# Patient Record
Sex: Female | Born: 1970
Health system: Southern US, Community
[De-identification: ages and names within clinical notes are randomized; demographics above are authoritative.]

## PROBLEM LIST (undated history)

## (undated) DIAGNOSIS — K200 Eosinophilic esophagitis: Secondary | ICD-10-CM

## (undated) DIAGNOSIS — G43909 Migraine, unspecified, not intractable, without status migrainosus: Secondary | ICD-10-CM

## (undated) DIAGNOSIS — K219 Gastro-esophageal reflux disease without esophagitis: Secondary | ICD-10-CM

## (undated) DIAGNOSIS — Z9889 Other specified postprocedural states: Secondary | ICD-10-CM

## (undated) DIAGNOSIS — F329 Major depressive disorder, single episode, unspecified: Secondary | ICD-10-CM

## (undated) DIAGNOSIS — F419 Anxiety disorder, unspecified: Secondary | ICD-10-CM

## (undated) DIAGNOSIS — K589 Irritable bowel syndrome without diarrhea: Secondary | ICD-10-CM

## (undated) DIAGNOSIS — F32A Depression, unspecified: Secondary | ICD-10-CM

## (undated) DIAGNOSIS — R112 Nausea with vomiting, unspecified: Secondary | ICD-10-CM

## (undated) HISTORY — DX: Eosinophilic esophagitis: K20.0

## (undated) HISTORY — DX: Major depressive disorder, single episode, unspecified: F32.9

## (undated) HISTORY — DX: Depression, unspecified: F32.A

## (undated) HISTORY — PX: OTHER SURGICAL HISTORY: SHX169

## (undated) HISTORY — DX: Irritable bowel syndrome, unspecified: K58.9

## (undated) HISTORY — DX: Migraine, unspecified, not intractable, without status migrainosus: G43.909

## (undated) HISTORY — PX: LAPAROSCOPIC ABDOMINAL EXPLORATION: SHX6249

---

## 1998-02-23 ENCOUNTER — Encounter: Admission: RE | Admit: 1998-02-23 | Discharge: 1998-05-24 | Payer: Self-pay | Admitting: Family Medicine

## 2002-03-03 ENCOUNTER — Other Ambulatory Visit: Admission: RE | Admit: 2002-03-03 | Discharge: 2002-03-03 | Payer: Self-pay | Admitting: Unknown Physician Specialty

## 2002-10-11 ENCOUNTER — Encounter: Payer: Self-pay | Admitting: Unknown Physician Specialty

## 2002-10-11 ENCOUNTER — Ambulatory Visit (HOSPITAL_COMMUNITY): Admission: RE | Admit: 2002-10-11 | Discharge: 2002-10-11 | Payer: Self-pay | Admitting: Unknown Physician Specialty

## 2002-10-29 ENCOUNTER — Ambulatory Visit (HOSPITAL_COMMUNITY): Admission: RE | Admit: 2002-10-29 | Discharge: 2002-10-29 | Payer: Self-pay | Admitting: Unknown Physician Specialty

## 2002-10-29 ENCOUNTER — Encounter: Payer: Self-pay | Admitting: Unknown Physician Specialty

## 2004-07-18 ENCOUNTER — Ambulatory Visit (HOSPITAL_COMMUNITY): Admission: RE | Admit: 2004-07-18 | Discharge: 2004-07-18 | Payer: Self-pay | Admitting: Unknown Physician Specialty

## 2004-11-22 ENCOUNTER — Ambulatory Visit (HOSPITAL_COMMUNITY): Admission: RE | Admit: 2004-11-22 | Discharge: 2004-11-22 | Payer: Self-pay | Admitting: Obstetrics & Gynecology

## 2007-11-26 HISTORY — PX: DILATION AND CURETTAGE OF UTERUS: SHX78

## 2009-06-13 ENCOUNTER — Ambulatory Visit: Payer: Self-pay | Admitting: Cardiology

## 2011-04-12 NOTE — Op Note (Signed)
Kayla White, BANFIELD    ACCOUNT NO.:  [id number]   MEDICAL RECORD NO.:  [id number]          PATIENT TYPE:  AMB   LOCATION:  DAY                           FACILITY:  [facility]   PHYSICIAN:  Lazaro Arms, M.D.   DATE OF BIRTH:  [birth year]   DATE OF PROCEDURE:  11/22/2004  DATE OF DISCHARGE:  11/22/2004                                 OPERATIVE REPORT   PREOPERATIVE DIAGNOSIS:  1.  Menometrorrhagia.  2.  Dysmenorrhea.   POSTOPERATIVE DIAGNOSIS:  1.  Menometrorrhagia.  2.  Dysmenorrhea.   PROCEDURE:  Diagnostic laparoscopy with chromotubation.   ANESTHESIA:  General endotracheal.   SURGEON:  Dr. Despina Hidden.   FINDINGS:  The patient had a normal pelvis. She had no endometriosis.  Fallopian tubes were normal. Ovaries and uterus were normal. There was free  flow of the methylene blue dye. The patient had a normal liver, appendix.  All of the peritoneal cavity was normal. There was no evidence of any  endometriosis of any type. There was no adhesive disease. There was no  evidence of any infection.   DESCRIPTION OF PROCEDURE:  The patient was taken to the operating room,  placed in supine position where she underwent general endotracheal  anesthesia. She was then placed in the dorsal lithotomy position and prepped  and draped in the usual sterile fashion. Her bladder was drained, and  incision was made in the umbilicus, and direct visualization trocar was used  and placed in the peritoneal cavity with one pass without difficulty.  Incision was made two fingerbreadths above the umbilicus, and a 5-mm blunt  tip trocar was then placed without difficulty. The above noted findings were  seen. Again, uterus, ovaries, tubes are normal. Normal fimbria. There was  good fill and spill rapidly of the methylene blue dye. There was no evidence  of any endometriosis anywhere. Uterosacral ligaments were normal. Appendix  was normal. Liver and upper abdomen was also normal. The patient  tolerated  the procedure well. Once irrigated it and got rid of all the dye, the  instruments were removed, gas was allowed to escape, the fascia  in the umbilical incision was closed, and the skin was closed using  Dermabond. The patient tolerated the procedure well. She experienced minimal  blood loss and was taken to recovery in good stable condition. All counts  were correct.      LHE/MEDQ  D:  01/23/2005  T:  01/23/2005  Job:  [id number]

## 2015-03-20 ENCOUNTER — Ambulatory Visit (HOSPITAL_COMMUNITY): Payer: MEDICAID | Admitting: Psychiatry

## 2016-06-04 ENCOUNTER — Encounter: Payer: Self-pay | Admitting: Neurology

## 2016-06-04 ENCOUNTER — Ambulatory Visit (INDEPENDENT_AMBULATORY_CARE_PROVIDER_SITE_OTHER): Payer: Self-pay | Admitting: Neurology

## 2016-06-04 VITALS — BP 122/73 | HR 79 | Ht 69.0 in | Wt 168.0 lb

## 2016-06-04 DIAGNOSIS — G4486 Cervicogenic headache: Secondary | ICD-10-CM

## 2016-06-04 DIAGNOSIS — R51 Headache: Secondary | ICD-10-CM

## 2016-06-04 DIAGNOSIS — S161XXS Strain of muscle, fascia and tendon at neck level, sequela: Secondary | ICD-10-CM

## 2016-06-04 DIAGNOSIS — S161XXA Strain of muscle, fascia and tendon at neck level, initial encounter: Secondary | ICD-10-CM | POA: Insufficient documentation

## 2016-06-04 MED ORDER — GABAPENTIN 300 MG PO CAPS: 300.0000 mg | ORAL_CAPSULE | Freq: Three times a day (TID) | ORAL | Status: DC

## 2016-06-04 MED ORDER — MELOXICAM 15 MG PO TABS
15.0000 mg | ORAL_TABLET | Freq: Every day | ORAL | Status: DC
Start: 2016-06-04 — End: 2016-10-24
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
## 2016-06-04 NOTE — Patient Instructions (Signed)
With the gabapentin, start at one at night for 5 days, then take one twice a day for 5 days, then take one three times a day.   Neurontin (gabapentin) may result in drowsiness, ankle swelling, gait instability, or possibly dizziness. Please contact our office if significant side effects occur with this medication.  Cervical Sprain A cervical sprain is an injury in the neck in which the strong, fibrous tissues (ligaments) that connect your neck bones stretch or tear. Cervical sprains can range from mild to severe. Severe cervical sprains can cause the neck vertebrae to be unstable. This can lead to damage of the spinal cord and can result in serious nervous system problems. The amount of time it takes for a cervical sprain to get better depends on the cause and extent of the injury. Most cervical sprains heal in 1 to 3 weeks. CAUSES  Severe cervical sprains may be caused by:   Contact sport injuries (such as from football, rugby, wrestling, hockey, auto racing, gymnastics, diving, martial arts, or boxing).   Motor vehicle collisions.   Whiplash injuries. This is an injury from a sudden forward and backward whipping movement of the head and neck.  Falls.  Mild cervical sprains may be caused by:   Being in an awkward position, such as while cradling a telephone between your ear and shoulder.   Sitting in a chair that does not offer proper support.   Working at a poorly Marketing executive station.   Looking up or down for long periods of time.  SYMPTOMS   Pain, soreness, stiffness, or a burning sensation in the front, back, or sides of the neck. This discomfort may develop immediately after the injury or slowly, 24 hours or more after the injury.   Pain or tenderness directly in the middle of the back of the neck.   Shoulder or upper back pain.   Limited ability to move the neck.   Headache.   Dizziness.   Weakness, numbness, or tingling in the hands or arms.    Muscle spasms.   Difficulty swallowing or chewing.   Tenderness and swelling of the neck.  DIAGNOSIS  Most of the time your health care provider can diagnose a cervical sprain by taking your history and doing a physical exam. Your health care provider will ask about previous neck injuries and any known neck problems, such as arthritis in the neck. X-rays may be taken to find out if there are any other problems, such as with the bones of the neck. Other tests, such as a CT scan or MRI, may also be needed.  TREATMENT  Treatment depends on the severity of the cervical sprain. Mild sprains can be treated with rest, keeping the neck in place (immobilization), and pain medicines. Severe cervical sprains are immediately immobilized. Further treatment is done to help with pain, muscle spasms, and other symptoms and may include:  Medicines, such as pain relievers, numbing medicines, or muscle relaxants.   Physical therapy. This may involve stretching exercises, strengthening exercises, and posture training. Exercises and improved posture can help stabilize the neck, strengthen muscles, and help stop symptoms from returning.  HOME CARE INSTRUCTIONS   Put ice on the injured area.   Put ice in a plastic bag.   Place a towel between your skin and the bag.   Leave the ice on for 15-20 minutes, 3-4 times a day.   If your injury was severe, you may have been given a cervical collar to wear.  A cervical collar is a two-piece collar designed to keep your neck from moving while it heals.  Do not remove the collar unless instructed by your health care provider.  If you have long hair, keep it outside of the collar.  Ask your health care provider before making any adjustments to your collar. Minor adjustments may be required over time to improve comfort and reduce pressure on your chin or on the back of your head.  Ifyou are allowed to remove the collar for cleaning or bathing, follow your  health care provider's instructions on how to do so safely.  Keep your collar clean by wiping it with mild soap and water and drying it completely. If the collar you have been given includes removable pads, remove them every 1-2 days and hand wash them with soap and water. Allow them to air dry. They should be completely dry before you wear them in the collar.  If you are allowed to remove the collar for cleaning and bathing, wash and dry the skin of your neck. Check your skin for irritation or sores. If you see any, tell your health care provider.  Do not drive while wearing the collar.   Only take over-the-counter or prescription medicines for pain, discomfort, or fever as directed by your health care provider.   Keep all follow-up appointments as directed by your health care provider.   Keep all physical therapy appointments as directed by your health care provider.   Make any needed adjustments to your workstation to promote good posture.   Avoid positions and activities that make your symptoms worse.   Warm up and stretch before being active to help prevent problems.  SEEK MEDICAL CARE IF:   Your pain is not controlled with medicine.   You are unable to decrease your pain medicine over time as planned.   Your activity level is not improving as expected.  SEEK IMMEDIATE MEDICAL CARE IF:   You develop any bleeding.  You develop stomach upset.  You have signs of an allergic reaction to your medicine.   Your symptoms get worse.   You develop new, unexplained symptoms.   You have numbness, tingling, weakness, or paralysis in any part of your body.  MAKE SURE YOU:   Understand these instructions.  Will watch your condition.  Will get help right away if you are not doing well or get worse.   This information is not intended to replace advice given to you by your health care provider. Make sure you discuss any questions you have with your health care  provider.   Document Released: 09/08/2007 Document Revised: 11/16/2013 Document Reviewed: 05/19/2013 Elsevier Interactive Patient Education [employer]2016 [employer] Inc[employer]

## 2016-09-04 ENCOUNTER — Ambulatory Visit: Payer: Self-pay | Admitting: Adult Health
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
## 2016-09-05 ENCOUNTER — Encounter: Payer: Self-pay | Admitting: Adult Health

## 2016-10-22 ENCOUNTER — Ambulatory Visit: Payer: Self-pay | Admitting: Adult Health

## 2016-10-24 ENCOUNTER — Encounter: Payer: Self-pay | Admitting: Adult Health

## 2016-10-24 ENCOUNTER — Ambulatory Visit (INDEPENDENT_AMBULATORY_CARE_PROVIDER_SITE_OTHER): Payer: Self-pay | Admitting: Adult Health

## 2016-10-24 VITALS — BP 113/61 | HR 78 | Wt 171.0 lb

## 2016-10-24 DIAGNOSIS — S161XXS Strain of muscle, fascia and tendon at neck level, sequela: Secondary | ICD-10-CM

## 2016-10-24 DIAGNOSIS — R51 Headache: Secondary | ICD-10-CM

## 2016-10-24 DIAGNOSIS — G4486 Cervicogenic headache: Secondary | ICD-10-CM
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
## 2016-10-24 NOTE — Progress Notes (Signed)
I have read the note, and I agree with the clinical assessment and plan.  Alyda Megna KEITH   

## 2016-10-24 NOTE — Patient Instructions (Signed)
Continue Nortriptyline Can consider restarting gabapentin in the future If your symptoms worsen or you develop new symptoms please let us know.

## 2017-02-11 ENCOUNTER — Encounter: Payer: Self-pay | Admitting: Adult Health

## 2017-03-31 DIAGNOSIS — Z6824 Body mass index (BMI) 24.0-24.9, adult: Secondary | ICD-10-CM | POA: Diagnosis not present

## 2017-03-31 DIAGNOSIS — Z01419 Encounter for gynecological examination (general) (routine) without abnormal findings: Secondary | ICD-10-CM | POA: Diagnosis not present

## 2017-09-29 DIAGNOSIS — Z23 Encounter for immunization: Secondary | ICD-10-CM | POA: Diagnosis not present

## 2017-10-14 DIAGNOSIS — R51 Headache: Secondary | ICD-10-CM | POA: Diagnosis not present

## 2017-10-14 DIAGNOSIS — J301 Allergic rhinitis due to pollen: Secondary | ICD-10-CM | POA: Diagnosis not present

## 2017-10-14 DIAGNOSIS — M797 Fibromyalgia: Secondary | ICD-10-CM | POA: Diagnosis not present

## 2017-10-14 DIAGNOSIS — K589 Irritable bowel syndrome without diarrhea: Secondary | ICD-10-CM | POA: Diagnosis not present

## 2017-10-15 DIAGNOSIS — N949 Unspecified condition associated with female genital organs and menstrual cycle: Secondary | ICD-10-CM | POA: Diagnosis not present

## 2017-12-23 ENCOUNTER — Ambulatory Visit (INDEPENDENT_AMBULATORY_CARE_PROVIDER_SITE_OTHER): Payer: BLUE CROSS/BLUE SHIELD | Admitting: Neurology

## 2017-12-23 ENCOUNTER — Encounter: Payer: Self-pay | Admitting: Neurology

## 2017-12-23 VITALS — BP 125/63 | HR 93 | Ht 69.0 in | Wt 174.0 lb

## 2017-12-23 DIAGNOSIS — S161XXS Strain of muscle, fascia and tendon at neck level, sequela: Secondary | ICD-10-CM | POA: Diagnosis not present

## 2017-12-23 DIAGNOSIS — G4486 Cervicogenic headache: Secondary | ICD-10-CM

## 2017-12-23 DIAGNOSIS — R51 Headache: Secondary | ICD-10-CM | POA: Diagnosis not present

## 2017-12-23 MED ORDER — TIZANIDINE HCL 2 MG PO TABS: 2.0000 mg | ORAL_TABLET | Freq: Three times a day (TID) | ORAL | 3 refills | Status: DC

## 2017-12-23 NOTE — Patient Instructions (Signed)
   We will make a referral to physical therapy, and start tizanidine 2 mg tablet three times a day.
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
## 2017-12-25 ENCOUNTER — Ambulatory Visit: Payer: BLUE CROSS/BLUE SHIELD

## 2017-12-25 DIAGNOSIS — R05 Cough: Secondary | ICD-10-CM | POA: Diagnosis not present

## 2017-12-25 DIAGNOSIS — J069 Acute upper respiratory infection, unspecified: Secondary | ICD-10-CM | POA: Diagnosis not present

## 2017-12-25 DIAGNOSIS — J0100 Acute maxillary sinusitis, unspecified: Secondary | ICD-10-CM | POA: Diagnosis not present

## 2018-01-02 ENCOUNTER — Ambulatory Visit: Payer: Medicaid Other | Attending: Neurology | Admitting: Rehabilitation

## 2018-01-02 ENCOUNTER — Other Ambulatory Visit: Payer: Self-pay

## 2018-01-02 ENCOUNTER — Encounter: Payer: Self-pay | Admitting: Rehabilitation

## 2018-01-02 DIAGNOSIS — G44221 Chronic tension-type headache, intractable: Secondary | ICD-10-CM | POA: Insufficient documentation

## 2018-01-02 DIAGNOSIS — M542 Cervicalgia: Secondary | ICD-10-CM

## 2018-01-02 DIAGNOSIS — R293 Abnormal posture: Secondary | ICD-10-CM | POA: Diagnosis present
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
## 2018-01-05 ENCOUNTER — Ambulatory Visit: Payer: Medicaid Other | Admitting: Physical Therapy

## 2018-01-05 DIAGNOSIS — M542 Cervicalgia: Secondary | ICD-10-CM

## 2018-01-05 DIAGNOSIS — G44221 Chronic tension-type headache, intractable: Secondary | ICD-10-CM

## 2018-01-05 DIAGNOSIS — R293 Abnormal posture: Secondary | ICD-10-CM

## 2018-01-06 ENCOUNTER — Encounter: Payer: Self-pay | Admitting: Physical Therapy

## 2018-01-06 NOTE — Therapy (Signed)
[facility] [address] [place], [place], [address] Phone: [phone]   Fax:  [phone]  Physical Therapy Treatment  Patient Details  Name: Kayla White MRN: [id number] Date of Birth: 02/15/1971 Referring Provider: Stephanie Acreharles Willis, MD   Encounter Date: 01/05/2018  PT End of Session - 01/06/18 0841    Visit Number  2    Number of Visits  17    Date for PT Re-Evaluation  03/03/18    Authorization Type  BCBS 40.00 copay    PT Start Time  0215    PT Stop Time  0259    PT Time Calculation (min)  44 min    Activity Tolerance  Patient limited by pain    Behavior During Therapy  [facility] for tasks assessed/performed       Past Medical History:  Diagnosis Date  . Depression   . IBS (irritable bowel syndrome)     Past Surgical History:  Procedure Laterality Date  . LAPAROSCOPIC ABDOMINAL EXPLORATION     R/o endometriosis    There were no vitals filed for this visit.  Subjective Assessment - 01/06/18 0839    Subjective  Patient reports no change in her heacahce and the pain in her neck. She comes in today for a trial of trigger point dry needling.     Pertinent History  history of depression    Limitations  House hold activities;Sitting;Standing    How long can you sit comfortably?  Driving increases pain and headaches, sitting for long periods of time [employer](church and movies)    Patient Stated Goals  to get rid of these headaches    Currently in Pain?  Yes    Pain Score  3     Pain Location  Neck    Pain Orientation  Right;Left    Pain Descriptors / Indicators  Aching;Dull;Throbbing    Pain Type  Chronic pain    Pain Onset  More than a month ago    Pain Frequency  Constant    Aggravating Factors   sitting     Pain Relieving Factors  laying on an icepack                       [facility]/Exercise - 01/06/18 0001      Manual Therapy   Manual Therapy  Myofascial release;Soft tissue  mobilization;Manual Traction    Myofascial Release  IASTYM to bilateral upper traps; sub occipital release     Manual Traction  gentle to cervical spine        Trigger Point Dry Needling - 01/06/18 1158    Consent Given?  Yes    Education Handout Provided  Yes    Muscles Treated Upper Body  Upper trapezius;Suboccipitals muscle group;Longissimus    Upper Trapezius Response  Twitch reponse elicited    SubOccipitals Response  Twitch response elicited    Longissimus Response  Twitch response elicited           PT Education - 01/06/18 0840    Education provided  Yes    Education Details  benefits and risks of TPDN     Person(s) Educated  Patient    Methods  Explanation    Comprehension  Verbalized understanding;Returned demonstration;Verbal cues required       PT Short Term Goals - 01/02/18 1308      PT SHORT TERM GOAL #1   Title  Pt will be independent with initial HEP in order  to indicate improved mobility and decreased pain/headaches.  (Target Date: 02/01/18)    Time  4    Period  Weeks    Status  New      PT SHORT TERM GOAL #2   Title  Pt will report decrease in headaches by 25% in order to indicate decrease muscle tension and improved cervical mobility.      Time  4    Period  Weeks    Status  New      PT SHORT TERM GOAL #3   Title  Pt will improve cervical ROM by 5 deg in order to indicate improved functional cervical mobility.      Time  4    Period  Weeks    Status  New      PT SHORT TERM GOAL #4   Title  Pt will be able to perform neck flexor muscle endurance test for 10 secs in order to indicate improved deep cervical flexor strength.      Time  4    Period  Weeks    Status  New      PT SHORT TERM GOAL #5   Title  Pt will report no more than 7/10 pain at worst with functional mobility in order to indicate improved cervical mobility and strength.     Time  4    Period  Weeks    Status  New        PT Long Term Goals - 01/02/18 1318      PT LONG TERM  GOAL #1   Title  Pt will be independent with final HEP in order to indicate improved mobility and decreased pain/headaches.  (Target Date: 03/03/18)    Time  8    Period  Weeks    Status  New    Target Date  03/03/18      PT LONG TERM GOAL #2   Title  Pt will report decrease in headaches by 50% in order to indicate decreased muscle tension and improved cervical mobility.      Time  8    Period  Weeks    Status  New      PT LONG TERM GOAL #3   Title  Pt will improve cervical ROM (except L rotation) by 10 deg from baseline in order to indicate improved functional mobility.     Time  8    Period  Weeks    Status  New      PT LONG TERM GOAL #4   Title  Pt will report no more than 5/10 pain at worst in order to indicate improved cervical mobility and strength.     Time  8    Period  Weeks    Status  New      PT LONG TERM GOAL #5   Title  Pt will perform neck flexor endurance test x 20 secs in order to indicate improved deep cervical flexor strength.      Time  8    Period  Weeks    Status  New      Additional Long Term Goals   Additional Long Term Goals  Yes      PT LONG TERM GOAL #6   Title  Pt will verbalize return to leisure activities (working out, riding bike at gym) in order to indicate decrease pain and headaches limiting activity.      Time  8    Period  Weeks  Status  New            Plan - 01/06/18 1610    Clinical Impression Statement  Patient is tolerated treatment well. she had a good respose to suboccpital needling. She aslo had a good twitch reponse with the left upper trap. She also had a fair twitch repsose around c3-c4 paraspinals. Therapy reviwed self soft tissue mobilization. She would benefit from postural exercises and chin tucks next visit.     Clinical Presentation  Evolving    Clinical Decision Making  Moderate    Rehab Potential  Good    PT Frequency  2x / week    PT Duration  8 weeks    PT Next Visit Plan  assess shoulder strength, any  scapular dysfunction?, pt is highly irritable therefore take it slow with mobilization but needs PAs and lateral glides to improve mid to lower cervical spine mobility, upper thoracic spine mobility, soft tissue mobilization to upper cervical paraspinal muscles (dry needling here to help relieve headaches), suboccipital release, retraction (with traction when able), deep cervical flexor strengthening/endurance; assess tolerance to TPDN     Consulted and Agree with Plan of Care  Patient       Patient will benefit from skilled therapeutic intervention in order to improve the following deficits and impairments:  Decreased activity tolerance, Decreased range of motion, Hypomobility, Impaired perceived functional ability, Impaired flexibility, Postural dysfunction, Impaired sensation, Pain  Visit Diagnosis: Cervicalgia  Abnormal posture  Chronic tension-type headache, intractable     Problem List Patient Active Problem List   Diagnosis Date Noted  . Cervical strain 06/04/2016  . Cervicogenic headache 06/04/2016    Dessie Coma PT DPT  01/06/2018, 12:59 PM  [facility] [address] [place], [place], [address] Phone: [phone]   Fax:  [phone]  Name: Kayla White MRN: [id number] Date of Birth: 1971/06/02

## 2018-01-13 ENCOUNTER — Ambulatory Visit: Payer: Medicaid Other | Admitting: Physical Therapy

## 2018-01-13 ENCOUNTER — Telehealth: Payer: Self-pay | Admitting: Physical Therapy

## 2018-01-13 NOTE — Telephone Encounter (Signed)
Left message on mobile number regarding missed appointment and advised of next appointment time. Encouraged to contact us with questions. Shonica Weier C. Layliana Devins PT, DPT 01/13/18 12:13 PM

## 2018-01-15 ENCOUNTER — Ambulatory Visit: Payer: Medicaid Other | Admitting: Rehabilitation

## 2018-01-15 ENCOUNTER — Ambulatory Visit: Payer: Medicaid Other | Admitting: Physical Therapy

## 2018-01-15 ENCOUNTER — Encounter: Payer: Self-pay | Admitting: Physical Therapy

## 2018-01-15 DIAGNOSIS — G44221 Chronic tension-type headache, intractable: Secondary | ICD-10-CM

## 2018-01-15 DIAGNOSIS — R293 Abnormal posture: Secondary | ICD-10-CM

## 2018-01-15 DIAGNOSIS — M542 Cervicalgia: Secondary | ICD-10-CM

## 2018-01-15 NOTE — Therapy (Signed)
[facility] [address] [place], [place], [address] Phone: [phone]   Fax:  [phone]  Physical Therapy Treatment  Patient Details  Name: Kayla White MRN: [id number] Date of Birth: 05/23/1971 Referring Provider: Stephanie Acreharles Willis, MD   Encounter Date: 01/15/2018  PT End of Session - 01/15/18 1138    Visit Number  3    Number of Visits  17    Date for PT Re-Evaluation  03/03/18    PT Start Time  1017    PT Stop Time  1101    PT Time Calculation (min)  44 min    Activity Tolerance  Patient tolerated treatment well    Behavior During Therapy  [facility] for tasks assessed/performed       Past Medical History:  Diagnosis Date  . Depression   . IBS (irritable bowel syndrome)     Past Surgical History:  Procedure Laterality Date  . LAPAROSCOPIC ABDOMINAL EXPLORATION     R/o endometriosis    There were no vitals filed for this visit.  Subjective Assessment - 01/15/18 1021    Subjective  "I think the DN really helped calm it down, I only have a low grade HA today."    Currently in Pain?  Yes                      [facility] Adult PT Treatment/Exercise - 01/15/18 1142      Cryotherapy   Number Minutes Cryotherapy  10 Minutes    Cryotherapy Location  Cervical    Type of Cryotherapy  Ice pack      Manual Therapy   Manual Therapy  Joint mobilization;Soft tissue mobilization;Myofascial release;Taping    Manual therapy comments  skilled palpations and monitoring pt throughout treatment    Joint Mobilization  Grade 3 PA of T1-T7, and L first rib mob grade 3 with pt focusing on breathing in/out deeply to faciliate mob with movement.     Soft tissue mobilization  IASTM over bil upper trap/ levator scapulae, sub-occiptals    Myofascial Release  Fasctila rolling/ stretching of bil upper trap/ levator scapulae    Manual Traction  gentle to cervical spine     McConnell  L upper trap inhibition taping trial        Trigger Point Dry Needling - 01/15/18 1036    Consent Given?  Yes    Education Handout Provided  Yes    Muscles Treated Upper Body  Levator scapulae    Upper Trapezius Response  Palpable increased muscle length;Twitch reponse elicited    SubOccipitals Response  Twitch response elicited;Palpable increased muscle length    Levator Scapulae Response  Twitch response elicited;Palpable increased muscle length bil     Longissimus Response  Twitch response elicited;Palpable increased muscle length bil C4           PT Education - 01/15/18 1138    Education provided  Yes    Education Details  updated HEP for neck stability and stretching, benefits of upper trap inhibition taping and length of wear.     Person(s) Educated  Patient    Methods  Explanation;Verbal cues;Handout    Comprehension  Verbalized understanding;Verbal cues required       PT Short Term Goals - 01/02/18 1308      PT SHORT TERM GOAL #1   Title  Pt will be independent with initial HEP in order to indicate improved mobility and decreased pain/headaches.  (Target Date: 02/01/18)  Time  4    Period  Weeks    Status  New      PT SHORT TERM GOAL #2   Title  Pt will report decrease in headaches by 25% in order to indicate decrease muscle tension and improved cervical mobility.      Time  4    Period  Weeks    Status  New      PT SHORT TERM GOAL #3   Title  Pt will improve cervical ROM by 5 deg in order to indicate improved functional cervical mobility.      Time  4    Period  Weeks    Status  New      PT SHORT TERM GOAL #4   Title  Pt will be able to perform neck flexor muscle endurance test for 10 secs in order to indicate improved deep cervical flexor strength.      Time  4    Period  Weeks    Status  New      PT SHORT TERM GOAL #5   Title  Pt will report no more than 7/10 pain at worst with functional mobility in order to indicate improved cervical mobility and strength.     Time  4    Period  Weeks     Status  New        PT Long Term Goals - 01/02/18 1318      PT LONG TERM GOAL #1   Title  Pt will be independent with final HEP in order to indicate improved mobility and decreased pain/headaches.  (Target Date: 03/03/18)    Time  8    Period  Weeks    Status  New    Target Date  03/03/18      PT LONG TERM GOAL #2   Title  Pt will report decrease in headaches by 50% in order to indicate decreased muscle tension and improved cervical mobility.      Time  8    Period  Weeks    Status  New      PT LONG TERM GOAL #3   Title  Pt will improve cervical ROM (except L rotation) by 10 deg from baseline in order to indicate improved functional mobility.     Time  8    Period  Weeks    Status  New      PT LONG TERM GOAL #4   Title  Pt will report no more than 5/10 pain at worst in order to indicate improved cervical mobility and strength.     Time  8    Period  Weeks    Status  New      PT LONG TERM GOAL #5   Title  Pt will perform neck flexor endurance test x 20 secs in order to indicate improved deep cervical flexor strength.      Time  8    Period  Weeks    Status  New      Additional Long Term Goals   Additional Long Term Goals  Yes      PT LONG TERM GOAL #6   Title  Pt will verbalize return to leisure activities (working out, riding bike at gym) in order to indicate decrease pain and headaches limiting activity.      Time  8    Period  Weeks    Status  New  Plan - 01/15/18 1139    Clinical Impression Statement  pt reports she continues to have a low grade HA but the DN helped signficantly. Continued DN for bil sub-occipitals, cervical parapsinals at C5, upper trap/ levator scapulae. followed DN with IASTM and mobs for the thoracic spine and L first rib. She reported decreased tightness and improvement of her HA post session, just soreness from DN. Ice pakc post session.     PT Treatment/Interventions  ADLs/Self Care Home Management;Electrical  Stimulation;Ultrasound;Therapeutic activities;Therapeutic exercise;Neuromuscular re-education;Patient/family education;Manual techniques;Dry needling    PT Next Visit Plan  assess shoulder strength, any scapular dysfunction?, pt is highly irritable therefore take it slow with mobilization but needs PAs and lateral glides to improve mid to lower cervical spine mobility, upper thoracic spine mobility, soft tissue mobilization to upper cervical paraspinal muscles (dry needling here to help relieve headaches), suboccipital release, retraction (with traction when able), deep cervical flexor strengthening/endurance; assess tolerance to TPDN     PT Home Exercise Plan  upper trap stretch, levator scapulae stretching, upper cervical rotation, chin tuck (staring in supine)    Consulted and Agree with Plan of Care  Patient       Patient will benefit from skilled therapeutic intervention in order to improve the following deficits and impairments:  Decreased activity tolerance, Decreased range of motion, Hypomobility, Impaired perceived functional ability, Impaired flexibility, Postural dysfunction, Impaired sensation, Pain  Visit Diagnosis: Cervicalgia  Abnormal posture  Chronic tension-type headache, intractable     Problem List Patient Active Problem List   Diagnosis Date Noted  . Cervical strain 06/04/2016  . Cervicogenic headache 06/04/2016   Lulu Riding PT, DPT, LAT, ATC  01/15/18  11:46 AM      [facility] [address] [place], [place], [address] Phone: [phone]   Fax:  [phone]  Name: Kayla White MRN: [id number] Date of Birth: Apr 28, 1971

## 2018-01-16 ENCOUNTER — Ambulatory Visit: Payer: Medicaid Other | Admitting: Rehabilitation

## 2018-01-19 ENCOUNTER — Ambulatory Visit: Payer: Medicaid Other | Admitting: Physical Therapy

## 2018-01-19 ENCOUNTER — Encounter: Payer: Self-pay | Admitting: Physical Therapy

## 2018-01-19 DIAGNOSIS — M542 Cervicalgia: Secondary | ICD-10-CM | POA: Diagnosis not present

## 2018-01-19 DIAGNOSIS — R293 Abnormal posture: Secondary | ICD-10-CM

## 2018-01-19 NOTE — Therapy (Addendum)
[facility] [address] [place], [place], [address] Phone: [phone]   Fax:  [phone]  Physical Therapy Treatment  Patient Details  Name: Kayla White MRN: [id number] Date of Birth: [birth year] Referring Provider: Hoda Hon Acre, MD   Encounter Date: 01/19/2018  PT End of Session - 01/19/18 1313    Visit Number  4    Number of Visits  17    Date for PT Re-Evaluation  03/03/18    Authorization Type  [employer]BS 40.00 copay    PT Start Time  1229    PT Stop Time  1312    PT Time Calculation (min)  43 min    Activity Tolerance  Patient tolerated treatment well    Behavior During Therapy  [facility] for tasks assessed/performed       Past Medical History:  Diagnosis Date  . Depression   . IBS (irritable bowel syndrome)     Past Surgical History:  Procedure Laterality Date  . LAPAROSCOPIC ABDOMINAL EXPLORATION     R/o endometriosis    There were no vitals filed for this visit.  Subjective Assessment - 01/19/18 1231    Subjective  still having a little soreness from DN last week, but open to DN again today.    Patient Stated Goals  to get rid of these headaches    Currently in Pain?  Yes    Pain Score  2     Pain Location  Neck    Pain Orientation  Right;Left    Pain Descriptors / Indicators  Aching;Dull;Throbbing    Pain Type  Chronic pain    Pain Onset  More than a month ago    Pain Frequency  Constant    Aggravating Factors   sitting    Pain Relieving Factors  lying on ice pack                      [facility] Adult PT Treatment/Exercise - 01/19/18 0001      Manual Therapy   Manual therapy comments  skilled palpations and monitoring pt throughout treatment    Soft tissue mobilization  bil upper trap/ levator scapulae, sub-occiptals    Myofascial Release  suboccipital release and trigger point release to bil neck and upper traps/levator scapluae    McConnell  L upper trap inhibition  taping       Trigger Point Dry Needling - 01/19/18 1356    Consent Given?  Yes    Muscles Treated Upper Body  Upper trapezius;Suboccipitals muscle group;Levator scapulae    Upper Trapezius Response  Twitch reponse elicited;Palpable increased muscle length    SubOccipitals Response  Twitch response elicited;Palpable increased muscle length    Levator Scapulae Response  Twitch response elicited;Palpable increased muscle length             PT Short Term Goals - 01/02/18 1308      PT SHORT TERM GOAL #1   Title  Pt will be independent with initial HEP in order to indicate improved mobility and decreased pain/headaches.  (Target Date: 02/01/18)    Time  4    Period  Weeks    Status  New      PT SHORT TERM GOAL #2   Title  Pt will report decrease in headaches by 25% in order to indicate decrease muscle tension and improved cervical mobility.      Time  4    Period  Weeks    Status  New  PT SHORT TERM GOAL #3   Title  Pt will improve cervical ROM by 5 deg in order to indicate improved functional cervical mobility.      Time  4    Period  Weeks    Status  New      PT SHORT TERM GOAL #4   Title  Pt will be able to perform neck flexor muscle endurance test for 10 secs in order to indicate improved deep cervical flexor strength.      Time  4    Period  Weeks    Status  New      PT SHORT TERM GOAL #5   Title  Pt will report no more than 7/10 pain at worst with functional mobility in order to indicate improved cervical mobility and strength.     Time  4    Period  Weeks    Status  New        PT Long Term Goals - 01/02/18 1318      PT LONG TERM GOAL #1   Title  Pt will be independent with final HEP in order to indicate improved mobility and decreased pain/headaches.  (Target Date: 03/03/18)    Time  8    Period  Weeks    Status  New    Target Date  03/03/18      PT LONG TERM GOAL #2   Title  Pt will report decrease in headaches by 50% in order to indicate decreased  muscle tension and improved cervical mobility.      Time  8    Period  Weeks    Status  New      PT LONG TERM GOAL #3   Title  Pt will improve cervical ROM (except L rotation) by 10 deg from baseline in order to indicate improved functional mobility.     Time  8    Period  Weeks    Status  New      PT LONG TERM GOAL #4   Title  Pt will report no more than 5/10 pain at worst in order to indicate improved cervical mobility and strength.     Time  8    Period  Weeks    Status  New      PT LONG TERM GOAL #5   Title  Pt will perform neck flexor endurance test x 20 secs in order to indicate improved deep cervical flexor strength.      Time  8    Period  Weeks    Status  New      Additional Long Term Goals   Additional Long Term Goals  Yes      PT LONG TERM GOAL #6   Title  Pt will verbalize return to leisure activities (working out, riding bike at gym) in order to indicate decrease pain and headaches limiting activity.      Time  8    Period  Weeks    Status  New            Plan - 01/19/18 1313    Clinical Impression Statement  Pt tolerated DN well today; has tightness in SCM so recommend DN to this area next DN session.  Progressing slowly towards goals.    PT Treatment/Interventions  ADLs/Self Care Home Management;Electrical Stimulation;Ultrasound;Therapeutic activities;Therapeutic exercise;Neuromuscular re-education;Patient/family education;Manual techniques;Dry needling    PT Next Visit Plan  assess shoulder strength, any scapular dysfunction?, pt is highly irritable therefore take it slow  with mobilization but needs PAs and lateral glides to improve mid to lower cervical spine mobility, upper thoracic spine mobility, soft tissue mobilization to upper cervical paraspinal muscles (dry needling here to help relieve headaches), suboccipital release, retraction (with traction when able), deep cervical flexor strengthening/endurance; assess tolerance to TPDN; DN to SCM     Consulted and Agree with Plan of Care  Patient       Patient will benefit from skilled therapeutic intervention in order to improve the following deficits and impairments:  Decreased activity tolerance, Decreased range of motion, Hypomobility, Impaired perceived functional ability, Impaired flexibility, Postural dysfunction, Impaired sensation, Pain  Visit Diagnosis: Abnormal posture  Cervicalgia     Problem List Patient Active Problem List   Diagnosis Date Noted  . Cervical strain 06/04/2016  . Cervicogenic headache 06/04/2016      Clarita Crane, PT, DPT 01/19/18 1:56 PM    [facility] [address] [place], [place], [address] Phone: [phone]   Fax:  [phone]  Name: Kayla White MRN: [id number] Date of Birth: [birth year]

## 2018-01-22 ENCOUNTER — Ambulatory Visit: Payer: Medicaid Other | Admitting: Rehabilitation

## 2018-01-26 ENCOUNTER — Ambulatory Visit: Payer: Medicaid Other | Admitting: Physical Therapy

## 2018-01-29 ENCOUNTER — Ambulatory Visit: Payer: Medicaid Other | Admitting: Rehabilitation

## 2018-02-02 ENCOUNTER — Ambulatory Visit: Payer: Medicaid Other | Attending: Neurology | Admitting: Physical Therapy

## 2018-02-09 ENCOUNTER — Telehealth: Payer: Self-pay | Admitting: Physical Therapy

## 2018-02-09 ENCOUNTER — Ambulatory Visit: Payer: Self-pay | Admitting: Physical Therapy

## 2018-02-09 NOTE — Telephone Encounter (Signed)
Pt. Called this AM requesting to transfer care to [facility] location as it is closer to her home. Pt verbalized frustrations in the timeframe of our scheduling and period of awaiting insurance authorization. Asked patient to allow me time to gather some info so I had accurate information and I would f/u with her this PM. We have been playing phone tag/leaving voicemails for each other. When able to connect with patient, will work to get her scheduled for a visit at [facility] this week in order to submit info to insurance for auth and then allow for her requested transfer to [facility] location. During our conversation this AM, pt did not wish to be treated at [facility] as she feels the [facility] clinic is better equipped for her current needs. Will wait to confirm plan with patient when able to connect on phone again.

## 2018-02-09 NOTE — Telephone Encounter (Signed)
Spoke with patient and explained weintended to assess current status and request insurance authorization on 3/11 and she was unable to keep that appointment. Plan is for next visit to re-assess and [facility]. Pt agreed for reassessment at [facility] (scheduled tomorrow at 1:30) and then given number to [facility] supervisor to call and schedule treatment to begin next week.

## 2018-02-10 ENCOUNTER — Ambulatory Visit: Payer: Self-pay | Admitting: Physical Therapy

## 2018-02-10 NOTE — Telephone Encounter (Signed)
Pt called to cancel re-evaluation today due to unable to arrange childcare and make the commute. Asked if she could just be seen an [facility]. Explained that clinically better for re-assessment with a provider familiar to you when possible. Because of her reported circumstances, I offered to speak with the [facility] supervisor and see about just scheduling at that location for reassessment. I explained the first visit, regardless of location, would require a reassessment and not treatment because her insurance requires prior authorization for treatment. She verbalized understanding. After the call, spoke with Leia AlfBeth Murray, [facility] Supervisor, and she will contact patient to schedule.

## 2018-02-11 ENCOUNTER — Ambulatory Visit: Payer: Self-pay | Admitting: Physical Therapy

## 2018-02-16 ENCOUNTER — Ambulatory Visit: Payer: Self-pay | Admitting: Physical Therapy

## 2018-02-18 ENCOUNTER — Encounter: Payer: Self-pay | Admitting: Physical Therapy

## 2018-02-19 ENCOUNTER — Encounter (HOSPITAL_COMMUNITY): Payer: Self-pay

## 2018-02-19 ENCOUNTER — Ambulatory Visit (HOSPITAL_COMMUNITY): Payer: Medicaid Other | Attending: Family Medicine

## 2018-02-19 ENCOUNTER — Ambulatory Visit: Payer: Self-pay | Admitting: Rehabilitation

## 2018-02-19 DIAGNOSIS — R293 Abnormal posture: Secondary | ICD-10-CM | POA: Insufficient documentation

## 2018-02-19 DIAGNOSIS — G44221 Chronic tension-type headache, intractable: Secondary | ICD-10-CM | POA: Insufficient documentation

## 2018-02-19 DIAGNOSIS — M542 Cervicalgia: Secondary | ICD-10-CM | POA: Diagnosis present
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
## 2018-02-23 ENCOUNTER — Ambulatory Visit: Payer: Self-pay | Admitting: Rehabilitation

## 2018-02-23 ENCOUNTER — Ambulatory Visit: Payer: Self-pay | Admitting: Physical Therapy

## 2018-02-26 ENCOUNTER — Encounter (HOSPITAL_COMMUNITY): Payer: Self-pay

## 2018-02-26 ENCOUNTER — Telehealth (HOSPITAL_COMMUNITY): Payer: Self-pay

## 2018-02-26 ENCOUNTER — Ambulatory Visit: Payer: Self-pay | Admitting: Rehabilitation

## 2018-02-26 ENCOUNTER — Ambulatory Visit (HOSPITAL_COMMUNITY): Payer: No Typology Code available for payment source | Attending: Family Medicine

## 2018-02-26 DIAGNOSIS — R293 Abnormal posture: Secondary | ICD-10-CM | POA: Insufficient documentation

## 2018-02-26 DIAGNOSIS — M542 Cervicalgia: Secondary | ICD-10-CM | POA: Diagnosis present

## 2018-02-26 DIAGNOSIS — G44221 Chronic tension-type headache, intractable: Secondary | ICD-10-CM | POA: Insufficient documentation

## 2018-02-26 NOTE — Telephone Encounter (Signed)
I did run the [facility] for today with same response=-not eligible. . I informed the patient that she would be self-pay before she left and patient was fine with that. I ask pt to make more apptments and she only wants to do one at a time. NF 4/4

## 2018-02-26 NOTE — Therapy (Signed)
[facility] [address] [place], [place], [address] Phone: [phone]   Fax:  [phone]  Physical Therapy Treatment  Patient Details  Name: Kayla White MRN: [id number] Date of Birth: 1971/05/27 Referring Provider: Stephanie Acre, MD   Encounter Date: 02/26/2018  PT End of Session - 02/26/18 0828    Visit Number  2    Number of Visits  4    Date for PT Re-Evaluation  03/12/18    Authorization Type  Primary: BCBS, Secondary: Medicaid (submitted authorization for initial 3 visits on 02/19/18)    Authorization Time Period  02/19/18 to 03/12/18    Authorization - Visit Number  1    Authorization - Number of Visits  3    PT Start Time  0825    PT Stop Time  0914    PT Time Calculation (min)  49 min    Activity Tolerance  Patient tolerated treatment well    Behavior During Therapy  [facility] for tasks assessed/performed       Past Medical History:  Diagnosis Date  . Depression   . IBS (irritable bowel syndrome)     Past Surgical History:  Procedure Laterality Date  . LAPAROSCOPIC ABDOMINAL EXPLORATION     R/o endometriosis    There were no vitals filed for this visit.  Subjective Assessment - 02/26/18 0827    Subjective  Pt states that her HA really eased off after about 30-40 mins following last treatment session. Currently, she's having about 3/10 pain.     How long can you sit comfortably?  15-20 mins    Diagnostic tests  3 x-rays: has shown loss of natural cervical curve; per pt upper cervical spinal curve is coming back but the lower cervical curve is not    Currently in Pain?  Yes    Pain Score  3     Pain Location  Head    Pain Orientation  Posterior;Anterior    Pain Descriptors / Indicators  Dull;Aching    Pain Type  Chronic pain    Pain Onset  More than a month ago    Pain Frequency  Intermittent    Aggravating Factors   sitting still looking at something    Pain Relieving Factors  lying on ice pack    Effect  of Pain on Daily Activities  increases          [facility] Adult PT Treatment/Exercise - 02/26/18 0001      Manual Therapy   Manual Therapy  Manual Traction;Taping    Manual therapy comments  completed separate rest of treatment    Manual Traction  suboccipital release +gentle traction x4 mins at EOS for pain control    Kinesiotex  Inhibit Muscle      Kinesiotix   Inhibit Muscle   bil Upper trap inhibition x2 strips of tape each       Trigger Point Dry Needling - 02/26/18 0918    Consent Given?  Yes    Education Handout Provided  Yes    Muscles Treated Upper Body  Upper trapezius;Suboccipitals muscle group;Levator scapulae;Longissimus    Upper Trapezius Response  Twitch reponse elicited;Palpable increased muscle length    SubOccipitals Response  Twitch response elicited;Palpable increased muscle length    Levator Scapulae Response  Twitch response elicited;Palpable increased muscle length    Longissimus Response  Twitch response elicited;Palpable increased muscle length Cervical multifidis, splenius capitis/cervicis           PT Short  Term Goals - 02/19/18 1225      PT SHORT TERM GOAL #1   Title  Pt will be independent with initial HEP in order to indicate improved mobility and decreased pain/headaches.    Baseline  dependent    Time  3    Period  Weeks    Status  Achieved    Target Date  03/12/18      PT SHORT TERM GOAL #2   Title  Pt will report decrease in headaches by 25% in order to indicate decrease muscle tension and improved cervical mobility.      Baseline  3/28: reports headaches daily, increases as day goes on    Time  3    Period  Weeks    Status  On-going      PT SHORT TERM GOAL #3   Title  Pt will improve cervical ROM by 5 deg in order to indicate improved functional cervical mobility.      Baseline  3/28: flexion 38, ext 35, R LF 25, L LF 24, R rotation 63, L rotation 54    Time  3    Period  Weeks    Status  On-going      PT SHORT TERM GOAL #4    Title  Pt will be able to perform neck flexor muscle endurance test for 10 secs in order to indicate improved deep cervical flexor strength.      Baseline  3/28: unable to perform due to weakness at this time    Time  3    Period  Weeks    Status  On-going      PT SHORT TERM GOAL #5   Title  Pt will report no more than 7/10 pain at worst with functional mobility in order to indicate improved cervical mobility and strength.     Baseline  3/28: reports 10/10 at worst    Time  3    Period  Weeks    Status  On-going        PT Long Term Goals - 02/19/18 1227      PT LONG TERM GOAL #1   Title  Pt will be independent with final HEP in order to indicate improved mobility and decreased pain/headaches.    Time  9    Period  Weeks    Status  On-going    Target Date  04/23/18      PT LONG TERM GOAL #2   Title  Pt will report decrease in headaches by 50% in order to indicate decreased muscle tension and improved cervical mobility.      Baseline  3/28: reports headaches daily which increase as the day goes on    Time  9    Period  Weeks    Status  On-going      PT LONG TERM GOAL #3   Title  Pt will improve cervical ROM (except L rotation) by 10 deg from baseline in order to indicate improved functional mobility.     Baseline  3/28: flexion 38, ext 35, R LF 25, L LF 24, R rotation 63, L rotation 54    Time  9    Period  Weeks    Status  On-going      PT LONG TERM GOAL #4   Title  Pt will report no more than 5/10 pain at worst in order to indicate improved cervical mobility and strength.     Baseline  3/28: reports 10/10 at worst  Time  9    Period  Weeks    Status  On-going      PT LONG TERM GOAL #5   Title  Pt will perform neck flexor endurance test x 20 secs in order to indicate improved deep cervical flexor strength.      Baseline  3/28: unable to perform on eval due to weakness    Time  9    Period  Weeks    Status  On-going      PT LONG TERM GOAL #6   Title  Pt will  verbalize return to leisure activities (working out, riding bike at gym) in order to indicate decrease pain and headaches limiting activity.      Baseline  3/28: is not currently doing any leisure activity due to pain and headaches.     Time  9    Period  Weeks    Status  On-going            Plan - 02/26/18 7846    Clinical Impression Statement  Pt returns for her f/u visit for neck pain. Pt stating that her HA was decreased following manual suboccipital release last week, but it is still present this date. She continues with significant restrictions of upper trap, levator scap, suboccipitals, as well as cervical multifidis, splenius capitus/cervicis. PT dry needled each of these muscles this date, bilaterally. Pt with multiple twitch responses elicited throughout each muscle with palpable improvements in soft tissue restrictions. Ended with manual suboccipital release for continued pain control and muscle relaxation. Applied K-tape to bil upper trap for inhibition. Continue as planned, addressing soft tissue impairments to decrease overall pain.    Rehab Potential  Good    PT Frequency  1x / week    PT Duration  3 weeks    PT Treatment/Interventions  ADLs/Self Care Home Management;Electrical Stimulation;Ultrasound;Therapeutic activities;Therapeutic exercise;Neuromuscular re-education;Patient/family education;Manual techniques;Dry needling;Cryotherapy;Moist Heat;Traction;Balance training;Passive range of motion;Taping;Energy conservation    PT Next Visit Plan  Trigger point dry needling, taping for cervical musculature afterwards for continued postural proprioception input and pain control; manual STM, joint mobs, suboccipital release; lower cervical spine mobility; deep cervical flexor strengthening/endurance    PT Home Exercise Plan  3/28: educated pt to continue previously prescribed HEP    Consulted and Agree with Plan of Care  Patient       Patient will benefit from skilled therapeutic  intervention in order to improve the following deficits and impairments:  Decreased activity tolerance, Decreased range of motion, Hypomobility, Impaired flexibility, Postural dysfunction, Impaired sensation, Pain, Decreased strength, Increased fascial restricitons, Increased muscle spasms  Visit Diagnosis: Abnormal posture  Cervicalgia  Chronic tension-type headache, intractable     Problem List Patient Active Problem List   Diagnosis Date Noted  . Cervical strain 06/04/2016  . Cervicogenic headache 06/04/2016       Jac Canavan PT, DPT  [facility] [address] [place], [place], [address] Phone: [phone]   Fax:  [phone]  Name: Kayla White MRN: [id number] Date of Birth: Mar 30, 1971

## 2018-03-02 ENCOUNTER — Ambulatory Visit: Payer: Self-pay | Admitting: Rehabilitation

## 2018-03-05 ENCOUNTER — Ambulatory Visit: Payer: Medicaid Other | Admitting: Rehabilitation

## 2018-03-05 ENCOUNTER — Ambulatory Visit (HOSPITAL_COMMUNITY): Payer: No Typology Code available for payment source

## 2018-03-05 DIAGNOSIS — R293 Abnormal posture: Secondary | ICD-10-CM

## 2018-03-05 DIAGNOSIS — G44221 Chronic tension-type headache, intractable: Secondary | ICD-10-CM

## 2018-03-05 DIAGNOSIS — M542 Cervicalgia: Secondary | ICD-10-CM

## 2018-03-05 NOTE — Therapy (Signed)
[facility] [address] [place], [place], [address] Phone: [phone]   Fax:  [phone]  Physical Therapy Treatment  Patient Details  Name: Kayla White MRN: [id number] Date of Birth: Mar 28, 1971 Referring Provider: Stephanie Acre, MD   Encounter Date: 03/05/2018  PT End of Session - 03/05/18 1520    Visit Number  3    Number of Visits  4    Date for PT Re-Evaluation  03/12/18    Authorization Type  Primary: BC[employer], Secondary: Medicaid (submitted authorization for initial 3 visits on 02/19/18)    Authorization Time Period  02/19/18 to 03/12/18    Authorization - Visit Number  2    Authorization - Number of Visits  3    PT Start Time  1519    PT Stop Time  1608    PT Time Calculation (min)  49 min    Activity Tolerance  Patient tolerated treatment well    Behavior During Therapy  [facility] for tasks assessed/performed       Past Medical History:  Diagnosis Date  . Depression   . IBS (irritable bowel syndrome)     Past Surgical History:  Procedure Laterality Date  . LAPAROSCOPIC ABDOMINAL EXPLORATION     R/o endometriosis    There were no vitals filed for this visit.  Subjective Assessment - 03/05/18 1520    Subjective  Pt states that she has a bad HA right now. She has been at her kids' school sitting for 2 hours. She did well following her last treatment session. She had 4 good days after and didn't have to take anything for it until Sunday after.     How long can you sit comfortably?  15-20 mins    Diagnostic tests  3 x-rays: has shown loss of natural cervical curve; per pt upper cervical spinal curve is coming back but the lower cervical curve is not    Currently in Pain?  Yes    Pain Score  5     Pain Location  Head    Pain Orientation  Posterior;Anterior    Pain Descriptors / Indicators  Dull;Aching    Pain Type  Chronic pain    Pain Onset  More than a month ago    Pain Frequency  Intermittent    Aggravating  Factors   sitting still looking at something    Pain Relieving Factors  lying on ice pack    Effect of Pain on Daily Activities  increases           [facility] Adult PT Treatment/Exercise - 03/05/18 0001      Manual Therapy   Manual Therapy  Soft tissue mobilization;Manual Traction;Taping    Manual therapy comments  completed separate rest of treatment    Soft tissue mobilization  bil SCM    Manual Traction  suboccipital release +gentle traction for pain control x6 mins at beginning of session    Kinesiotex  Inhibit Muscle      Kinesiotix   Inhibit Muscle   bil upper trap and levator scap inhibition (2 "y" strips bilaterally)       Trigger Point Dry Needling - 03/05/18 1626    Consent Given?  Yes    Education Handout Provided  No    Muscles Treated Upper Body  Suboccipitals muscle group;Sternocleidomastoid    Sternocleidomastoid Response  Twitch response elicited;Palpable increased muscle length bil    SubOccipitals Response  Twitch response elicited;Palpable increased muscle length bil  PT Education - 03/05/18 1622    Education provided  Yes    Education Details  continue HEP, trigger point dry needling risks and benefits; remove tape if causes skin irritation    Person(s) Educated  Patient    Methods  Explanation    Comprehension  Verbalized understanding       PT Short Term Goals - 02/19/18 1225      PT SHORT TERM GOAL #1   Title  Pt will be independent with initial HEP in order to indicate improved mobility and decreased pain/headaches.    Baseline  dependent    Time  3    Period  Weeks    Status  Achieved    Target Date  03/12/18      PT SHORT TERM GOAL #2   Title  Pt will report decrease in headaches by 25% in order to indicate decrease muscle tension and improved cervical mobility.      Baseline  3/28: reports headaches daily, increases as day goes on    Time  3    Period  Weeks    Status  On-going      PT SHORT TERM GOAL #3   Title  Pt  will improve cervical ROM by 5 deg in order to indicate improved functional cervical mobility.      Baseline  3/28: flexion 38, ext 35, R LF 25, L LF 24, R rotation 63, L rotation 54    Time  3    Period  Weeks    Status  On-going      PT SHORT TERM GOAL #4   Title  Pt will be able to perform neck flexor muscle endurance test for 10 secs in order to indicate improved deep cervical flexor strength.      Baseline  3/28: unable to perform due to weakness at this time    Time  3    Period  Weeks    Status  On-going      PT SHORT TERM GOAL #5   Title  Pt will report no more than 7/10 pain at worst with functional mobility in order to indicate improved cervical mobility and strength.     Baseline  3/28: reports 10/10 at worst    Time  3    Period  Weeks    Status  On-going        PT Long Term Goals - 02/19/18 1227      PT LONG TERM GOAL #1   Title  Pt will be independent with final HEP in order to indicate improved mobility and decreased pain/headaches.    Time  9    Period  Weeks    Status  On-going    Target Date  04/23/18      PT LONG TERM GOAL #2   Title  Pt will report decrease in headaches by 50% in order to indicate decreased muscle tension and improved cervical mobility.      Baseline  3/28: reports headaches daily which increase as the day goes on    Time  9    Period  Weeks    Status  On-going      PT LONG TERM GOAL #3   Title  Pt will improve cervical ROM (except L rotation) by 10 deg from baseline in order to indicate improved functional mobility.     Baseline  3/28: flexion 38, ext 35, R LF 25, L LF 24, R rotation 63, L rotation 54  Time  9    Period  Weeks    Status  On-going      PT LONG TERM GOAL #4   Title  Pt will report no more than 5/10 pain at worst in order to indicate improved cervical mobility and strength.     Baseline  3/28: reports 10/10 at worst    Time  9    Period  Weeks    Status  On-going      PT LONG TERM GOAL #5   Title  Pt will  perform neck flexor endurance test x 20 secs in order to indicate improved deep cervical flexor strength.      Baseline  3/28: unable to perform on eval due to weakness    Time  9    Period  Weeks    Status  On-going      PT LONG TERM GOAL #6   Title  Pt will verbalize return to leisure activities (working out, riding bike at gym) in order to indicate decrease pain and headaches limiting activity.      Baseline  3/28: is not currently doing any leisure activity due to pain and headaches.     Time  9    Period  Weeks    Status  On-going            Plan - 03/05/18 1624    Clinical Impression Statement  Pt presents to therapy with increased HA this date. Began with suboccipital release and STM to bil SCMs; pt with palpable restrictions throughout and with referral pain into forehead with palpation to bil SCMs, L>R. Pt reported decreased HA with manual STM so PT proceeded with trigger point dry needling of bil suboccipitals and bil SCM. Good twitch responses elicited with each muscle group and decreasing HA symptoms reported. Had pt perform supine cervical retractions afterwards for muscle activation and relaxation. Applied rock tape to bil levator scap and UT for muscle inhibition this date (2 "y" strips, bilaterally). Pt stated that the K-tape started to itch and caused some skin irritation and she asked that a bandaid be placed where the anchor for the tape would be. Educated pt that if this tape also irritates her skin then to remove it as well. Pt reported decreased HA to 3/10 and stated that it was steadily going down and much improved from beginning of session. Continued as planned, progressing as able. Pt due for reassessment and reauthorization next visit.    Rehab Potential  Good    PT Frequency  1x / week    PT Duration  3 weeks    PT Treatment/Interventions  ADLs/Self Care Home Management;Electrical Stimulation;Ultrasound;Therapeutic activities;Therapeutic exercise;Neuromuscular  re-education;Patient/family education;Manual techniques;Dry needling;Cryotherapy;Moist Heat;Traction;Balance training;Passive range of motion;Taping;Energy conservation    PT Next Visit Plan  Reassessment and reauthorization; Continue trigger point dry needling, taping for cervical musculature afterwards for continued postural proprioception input and pain control; manual STM, joint mobs, suboccipital release; lower cervical spine mobility; deep cervical flexor strengthening/endurance    PT Home Exercise Plan  3/28: educated pt to continue previously prescribed HEP    Consulted and Agree with Plan of Care  Patient       Patient will benefit from skilled therapeutic intervention in order to improve the following deficits and impairments:  Decreased activity tolerance, Decreased range of motion, Hypomobility, Impaired flexibility, Postural dysfunction, Impaired sensation, Pain, Decreased strength, Increased fascial restricitons, Increased muscle spasms  Visit Diagnosis: Abnormal posture  Cervicalgia  Chronic tension-type headache, intractable  Problem List Patient Active Problem List   Diagnosis Date Noted  . Cervical strain 06/04/2016  . Cervicogenic headache 06/04/2016        Jac Canavan PT, DPT   [facility] [address] [place], [place], [address] Phone: [phone]   Fax:  [phone]  Name: Kayla White MRN: [id number] Date of Birth: 07-Mar-1971

## 2018-03-12 ENCOUNTER — Encounter (HOSPITAL_COMMUNITY): Payer: Self-pay

## 2018-03-12 ENCOUNTER — Ambulatory Visit (HOSPITAL_COMMUNITY): Payer: No Typology Code available for payment source

## 2018-03-12 DIAGNOSIS — G44221 Chronic tension-type headache, intractable: Secondary | ICD-10-CM

## 2018-03-12 DIAGNOSIS — R293 Abnormal posture: Secondary | ICD-10-CM | POA: Diagnosis not present

## 2018-03-12 DIAGNOSIS — M542 Cervicalgia: Secondary | ICD-10-CM

## 2018-03-12 NOTE — Therapy (Signed)
[place], [place], [address] Phone: [phone]   Fax:  [phone]    Progress Note Reporting Period 02/19/18 to 03/12/18  See note below for Objective Data and Assessment of Progress/Goals.    Physical Therapy Treatment/Reassessment  Patient Details  Name: Kayla White MRN: [id number] Date of Birth: 07-23-1971 Referring Provider: Margette Fast, MD   Encounter Date: 03/12/2018  PT End of Session - 03/12/18 1215    Visit Number  4    Number of Visits  16    Date for PT Re-Evaluation  04/23/18 reassess 04/02/2018    Authorization Type  Self-Pay; Medicaid? Per pt "in transition" for Medicaid -- PT submitted reauthorization for 12 more visits on 03/12/18    Authorization Time Period  02/19/18 to 03/12/18; NEW: 03/12/18 to 04/23/18    Authorization - Visit Number  0    Authorization - Number of Visits  12    PT Start Time  1030    PT Stop Time  1117    PT Time Calculation (min)  47 min    Activity Tolerance  Patient tolerated treatment well    Behavior During Therapy  [facility] for tasks assessed/performed       Past Medical History:  Diagnosis Date  . Depression   . IBS (irritable bowel syndrome)     Past Surgical History:  Procedure Laterality Date  . LAPAROSCOPIC ABDOMINAL EXPLORATION     R/o endometriosis    There were no vitals filed for this visit.  Subjective Assessment - 03/12/18 1032    Subjective  Pt states that she had the worse HA after Thursday's session She currently has a small, nagging HA. The Rock tape still caused her some skin irritation.     How long can you sit comfortably?  15-20 mins    Diagnostic tests  3 x-rays: has shown loss of natural cervical curve; per pt upper cervical spinal curve is coming back but the lower cervical curve is not    Currently in Pain?  Yes    Pain Score  2     Pain Location  Head    Pain Orientation  Anterior;Posterior    Pain Descriptors / Indicators   Aching;Dull    Pain Type  Chronic pain    Pain Onset  More than a month ago    Pain Frequency  Intermittent    Aggravating Factors   sitting still looking at something    Pain Relieving Factors  lying on ice pack    Effect of Pain on Daily Activities  increases         [facility] PT Assessment - 03/12/18 0001      Assessment   Medical Diagnosis  cervical strain, headaches    Referring Provider  Margette Fast, MD    Onset Date/Surgical Date  -- 2 YA    Prior Therapy  previous PT and chiropractor      Observation/Other Assessments   Observations  cervical extension and flexion noted to be primarily from upper cervical spine with min to no involvement from lower spine      Functional Tests   Functional tests  Other      Other:   Other/ Comments  Deep cervical neck flexion test: 6 sec without UE support -- pt almost instantly loses chin tuck and has increased pain      Posture/Postural Control   Posture/Postural Control  Postural limitations    Postural Limitations  Forward  head;Increased thoracic kyphosis      AROM   Cervical Flexion  38 was 38    Cervical Extension  35 was 35    Cervical - Right Side Bend  25 was 25    Cervical - Left Side Bend  27 was 24    Cervical - Right Rotation  78 was 63    Cervical - Left Rotation  54 was 54      Palpation   Spinal mobility  cervical spine hypomobile and tender; thoracic spine hypomobile    Palpation comment  soft tissue restrictions, multiple taut bands, and tenderness to palpation throughout cervical paraspinals, levator scap, middle trap, rhomboids, and cervical and thoracic paraspinals, L>R throughout           [facility] Adult PT Treatment/Exercise - 03/12/18 0001      Manual Therapy   Manual Therapy  Manual Traction    Manual therapy comments  completed separate rest of treatment    Manual Traction  suboccipital release +gentle traction for pain control x5 mins       Trigger Point Dry Needling - 03/12/18 1225    Consent  Given?  Yes    Education Handout Provided  No    Muscles Treated Upper Body  Upper trapezius;Suboccipitals muscle group;Levator scapulae cervical multifidis, splenius capitis/cervicis; bil thru out    Sternocleidomastoid Response  Twitch response elicited;Palpable increased muscle length    Upper Trapezius Response  Twitch reponse elicited;Palpable increased muscle length    SubOccipitals Response  Twitch response elicited;Palpable increased muscle length    Levator Scapulae Response  Twitch response elicited;Palpable increased muscle length    Longissimus Response  Twitch response elicited;Palpable increased muscle length cervical multifidis, splenius capitis and cervicis             PT Short Term Goals - 03/12/18 1036      PT SHORT TERM GOAL #1   Title  Pt will be independent with initial HEP in order to indicate improved mobility and decreased pain/headaches.    Baseline  dependent    Time  3    Period  Weeks    Status  Achieved      PT SHORT TERM GOAL #2   Title  Pt will report decrease in headaches by 25% in order to indicate decrease muscle tension and improved cervical mobility.      Baseline  4/18: HA are better, but not by 25%; still reports headaches daily, increases as day goes on    Time  3    Period  Weeks    Status  On-going      PT SHORT TERM GOAL #3   Title  Pt will improve cervical ROM by 5 deg in order to indicate improved functional cervical mobility.      Baseline  4/18: flexion 38, ext 35, R LF 25, L LF 27, R rotation 78, L rotation 54    Time  3    Period  Weeks    Status  On-going      PT SHORT TERM GOAL #4   Title  Pt will be able to perform neck flexor muscle endurance test for 10 secs in order to indicate improved deep cervical flexor strength.      Baseline  4/18: 6 sec, almost instantly loses chin tuck and has increased pain    Time  3    Period  Weeks    Status  On-going      PT SHORT TERM GOAL #  5   Title  Pt will report no more than 7/10  pain at worst with functional mobility in order to indicate improved cervical mobility and strength.     Baseline  4/18: last Thursday was a 10/10 but average 5-6/10    Time  3    Period  Weeks    Status  Partially Met        PT Long Term Goals - 03/12/18 1039      PT LONG TERM GOAL #1   Title  Pt will be independent with final HEP in order to indicate improved mobility and decreased pain/headaches.    Time  9    Period  Weeks    Status  On-going      PT LONG TERM GOAL #2   Title  Pt will report decrease in headaches by 50% in order to indicate decreased muscle tension and improved cervical mobility.      Baseline  4/18: HA are better, but not by 25%; still reports headaches daily, increases as day goes on    Time  9    Period  Weeks    Status  On-going      PT LONG TERM GOAL #3   Title  Pt will improve cervical ROM (except L rotation) by 10 deg from baseline in order to indicate improved functional mobility.     Baseline  3/28: flexion 38, ext 35, R LF 25, L LF 27, R rotation 78, L rotation 54    Time  9    Period  Weeks    Status  On-going      PT LONG TERM GOAL #4   Title  Pt will report no more than 5/10 pain at worst in order to indicate improved cervical mobility and strength.     Baseline  4/18: last Thursday was a 10/10 but average 5-6/10    Time  9    Period  Weeks    Status  On-going      PT LONG TERM GOAL #5   Title  Pt will perform neck flexor endurance test x 20 secs in order to indicate improved deep cervical flexor strength.      Baseline  4/18: 6 sec, almost instantly loses chin tuck and has increased pain    Time  9    Period  Weeks    Status  On-going      PT LONG TERM GOAL #6   Title  Pt will verbalize return to leisure activities (working out, riding bike at gym) in order to indicate decrease pain and headaches limiting activity.      Baseline  4/18: is not currently doing any leisure activity due to pain and headaches.     Time  9    Period  Weeks     Status  On-going            Plan - 03/12/18 1217    Clinical Impression Statement  PT reassessed pt's goals and outcome measures this date. Pt has made good progress towards goals since her initial evaluation. Overall, her HA have decreased, but she still has them every day and they increase as the day goes on. Most of her cervical mobility continues to occur in her upper cervical spine, with hardly any flexion/extension coming from lower cervical spine. She continues with soft tissue restrictions of bil upper trap, levator scap, cervical multifidis, cervical extensors, and suboccipitals, all of which are tender to palpation with referrals into her  posterior and anterior head, recreating her HA. Pt needs continued skilled PT intervention to address continued deficits in order to decrease pt's HA and improve her overall QOL. Ended session with trigger point dry needling to muscles listed above bilaterally with good twitch responses elicited. R suboccipital trigger point elicited referred pain into posterior head and it began to decrease as trigger point released. Continue as planned, progressing as able and begin to perform postural strengthening, cervical joint mobs, and address lower cervical hypomobility.    Rehab Potential  Good    PT Frequency  1x / week    PT Duration  3 weeks    PT Treatment/Interventions  ADLs/Self Care Home Management;Electrical Stimulation;Ultrasound;Therapeutic activities;Therapeutic exercise;Neuromuscular re-education;Patient/family education;Manual techniques;Dry needling;Cryotherapy;Moist Heat;Traction;Balance training;Passive range of motion;Taping;Energy conservation    PT Next Visit Plan  Continue trigger point dry needling, manual STM, joint mobs, suboccipital release; lower cervical spine mobility; deep cervical flexor strengthening/endurance; postural strengthening; do not tape anymore as pt has allergy to adhesive AEB skin irritation    PT Home Exercise Plan   3/28: educated pt to continue previously prescribed HEP    Consulted and Agree with Plan of Care  Patient       Patient will benefit from skilled therapeutic intervention in order to improve the following deficits and impairments:  Decreased activity tolerance, Decreased range of motion, Hypomobility, Impaired flexibility, Postural dysfunction, Impaired sensation, Pain, Decreased strength, Increased fascial restricitons, Increased muscle spasms  Visit Diagnosis: Abnormal posture - Plan: PT plan of care cert/re-cert  Cervicalgia - Plan: PT plan of care cert/re-cert  Chronic tension-type headache, intractable - Plan: PT plan of care cert/re-cert     Problem List Patient Active Problem List   Diagnosis Date Noted  . Cervical strain 06/04/2016  . Cervicogenic headache 06/04/2016      Geraldine Solar PT, DPT  [facility] [address] [place], [place], [address] Phone: [phone]   Fax:  [phone]  Name: Kayla White MRN: [id number] Date of Birth: 17-Jan-1971

## 2018-03-23 ENCOUNTER — Telehealth (HOSPITAL_COMMUNITY): Payer: Self-pay

## 2018-03-23 ENCOUNTER — Ambulatory Visit (HOSPITAL_COMMUNITY): Payer: No Typology Code available for payment source

## 2018-03-23 NOTE — Telephone Encounter (Signed)
She will be unavailable today and will call back to r/s

## 2018-03-26 ENCOUNTER — Ambulatory Visit (HOSPITAL_COMMUNITY): Payer: No Typology Code available for payment source | Attending: Family Medicine

## 2018-03-26 ENCOUNTER — Encounter (HOSPITAL_COMMUNITY): Payer: Self-pay

## 2018-03-26 DIAGNOSIS — R293 Abnormal posture: Secondary | ICD-10-CM | POA: Insufficient documentation

## 2018-03-26 DIAGNOSIS — M542 Cervicalgia: Secondary | ICD-10-CM | POA: Insufficient documentation

## 2018-03-26 DIAGNOSIS — G44221 Chronic tension-type headache, intractable: Secondary | ICD-10-CM | POA: Diagnosis present

## 2018-03-26 NOTE — Therapy (Signed)
[place], [place], [address] Phone: [phone]   Fax:  [phone]  Physical Therapy Treatment  Patient Details  Name: Kayla White MRN: [id number] Date of Birth: May 21, 1971 Referring Provider: Margette Fast, MD   Encounter Date: 03/26/2018  PT End of Session - 03/26/18 0909    Visit Number  5    Number of Visits  16    Date for PT Re-Evaluation  04/23/18 reassess 04/02/2018    Authorization Type  Self-Pay; Medicaid? Per pt "in transition" for Medicaid -- Pt approved from Medicaid for 12 visits 03/16/18 to 04/26/18    Authorization Time Period  02/19/18 to 03/12/18; NEW: 03/12/18 to 04/23/18    Authorization - Visit Number  1    Authorization - Number of Visits  12    PT Start Time  0905    PT Stop Time  0945    PT Time Calculation (min)  40 min    Activity Tolerance  Patient tolerated treatment well    Behavior During Therapy  [facility] for tasks assessed/performed       Past Medical History:  Diagnosis Date  . Depression   . IBS (irritable bowel syndrome)     Past Surgical History:  Procedure Laterality Date  . LAPAROSCOPIC ABDOMINAL EXPLORATION     R/o endometriosis    There were no vitals filed for this visit.  Subjective Assessment - 03/26/18 0907    Subjective  Pt reports having a bad HA on Monday. Currently she's in about 2-3/10 neck pain.     How long can you sit comfortably?  15-20 mins    Diagnostic tests  3 x-rays: has shown loss of natural cervical curve; per pt upper cervical spinal curve is coming back but the lower cervical curve is not    Currently in Pain?  Yes    Pain Score  3     Pain Location  Neck    Pain Orientation  Anterior;Posterior    Pain Descriptors / Indicators  Aching;Dull    Pain Type  Chronic pain    Pain Onset  More than a month ago    Pain Frequency  Intermittent    Aggravating Factors   sitting still look at something    Pain Relieving Factors  lying on ice pack      Effect of Pain on Daily Activities  increases           [facility] Adult PT Treatment/Exercise - 03/26/18 0001      Exercises   Exercises  Neck      Neck Exercises: Machines for Strengthening   UBE (Upper Arm Bike)  x3 mins retro, L1, for postural strength      Neck Exercises: Theraband   Scapula Retraction  10 reps;Red;Limitations    Scapula Retraction Limitations  2 sets    Shoulder Extension  10 reps;Red    Shoulder Extension Limitations  2 sets    Rows  10 reps;Red    Rows Limitations  2 sets    Horizontal ABduction  10 reps;Red    Horizontal ABduction Limitations  2 sets       Neck Exercises: Seated   Other Seated Exercise  body blade horiz and vertical perturbations 3x10" BUE      Neck Exercises: Supine   Other Supine Exercise  deep cervical flexor holds 10x5" holds      Manual Therapy   Manual Therapy  Manual Traction;Myofascial release    Manual  therapy comments  completed separate rest of treatment    Myofascial Release  bil pec major/minor and bil upper trap    Manual Traction  suboccipital release +gentle traction for pain control x5 mins      Neck Exercises: Stretches   Other Neck Stretches  pec stretch in doorway 3x30"           PT Education - 03/26/18 0909    Education provided  Yes    Education Details  exercise technique, continue HEP, can resume DN next visit if she did not respond well to therex and STM only    Person(s) Educated  Patient    Methods  Explanation;Demonstration    Comprehension  Verbalized understanding;Returned demonstration       PT Short Term Goals - 03/12/18 1036      PT SHORT TERM GOAL #1   Title  Pt will be independent with initial HEP in order to indicate improved mobility and decreased pain/headaches.    Baseline  dependent    Time  3    Period  Weeks    Status  Achieved      PT SHORT TERM GOAL #2   Title  Pt will report decrease in headaches by 25% in order to indicate decrease muscle tension and improved cervical  mobility.      Baseline  4/18: HA are better, but not by 25%; still reports headaches daily, increases as day goes on    Time  3    Period  Weeks    Status  On-going      PT SHORT TERM GOAL #3   Title  Pt will improve cervical ROM by 5 deg in order to indicate improved functional cervical mobility.      Baseline  4/18: flexion 38, ext 35, R LF 25, L LF 27, R rotation 78, L rotation 54    Time  3    Period  Weeks    Status  On-going      PT SHORT TERM GOAL #4   Title  Pt will be able to perform neck flexor muscle endurance test for 10 secs in order to indicate improved deep cervical flexor strength.      Baseline  4/18: 6 sec, almost instantly loses chin tuck and has increased pain    Time  3    Period  Weeks    Status  On-going      PT SHORT TERM GOAL #5   Title  Pt will report no more than 7/10 pain at worst with functional mobility in order to indicate improved cervical mobility and strength.     Baseline  4/18: last Thursday was a 10/10 but average 5-6/10    Time  3    Period  Weeks    Status  Partially Met        PT Long Term Goals - 03/12/18 1039      PT LONG TERM GOAL #1   Title  Pt will be independent with final HEP in order to indicate improved mobility and decreased pain/headaches.    Time  9    Period  Weeks    Status  On-going      PT LONG TERM GOAL #2   Title  Pt will report decrease in headaches by 50% in order to indicate decreased muscle tension and improved cervical mobility.      Baseline  4/18: HA are better, but not by 25%; still reports headaches daily, increases as day  goes on    Time  9    Period  Weeks    Status  On-going      PT LONG TERM GOAL #3   Title  Pt will improve cervical ROM (except L rotation) by 10 deg from baseline in order to indicate improved functional mobility.     Baseline  3/28: flexion 38, ext 35, R LF 25, L LF 27, R rotation 78, L rotation 54    Time  9    Period  Weeks    Status  On-going      PT LONG TERM GOAL #4    Title  Pt will report no more than 5/10 pain at worst in order to indicate improved cervical mobility and strength.     Baseline  4/18: last Thursday was a 10/10 but average 5-6/10    Time  9    Period  Weeks    Status  On-going      PT LONG TERM GOAL #5   Title  Pt will perform neck flexor endurance test x 20 secs in order to indicate improved deep cervical flexor strength.      Baseline  4/18: 6 sec, almost instantly loses chin tuck and has increased pain    Time  9    Period  Weeks    Status  On-going      PT LONG TERM GOAL #6   Title  Pt will verbalize return to leisure activities (working out, riding bike at gym) in order to indicate decrease pain and headaches limiting activity.      Baseline  4/18: is not currently doing any leisure activity due to pain and headaches.     Time  9    Period  Weeks    Status  On-going            Plan - 03/26/18 1157    Clinical Impression Statement  Began postural and scapular strengthening and stabilization exercises this date. Pt demo'ing fatigue and verbalizing of cervical musculature, indicating weakness of scapular stabilizers. Pt with difficulty body blade. Added pec stretch in doorway to facilitate improved postures and decreased forward rounded shoulders as she was noted to be in this posture during standing and sitting; she reported feeling very tight during this stretch. PT assessed pec minor/major tightness and pt with increased tightness throughout and multiple taut bands, all painful to palpation but no referred pain. Had pt perform deep cervical flexion reps today; she continues to have difficulty with this and had almost immediate increase in pain afterwards. Addressed this via suboccipital release and MFR to upper traps bilaterally. She reported decreased pain following but was still about a 2-3/10 at EOS. Continue to address soft tissue restrictions, postural and scap stabilization strengthening, and overall cervical strength and  mobility.     Rehab Potential  Good    PT Frequency  1x / week    PT Duration  3 weeks    PT Treatment/Interventions  ADLs/Self Care Home Management;Electrical Stimulation;Ultrasound;Therapeutic activities;Therapeutic exercise;Neuromuscular re-education;Patient/family education;Manual techniques;Dry needling;Cryotherapy;Moist Heat;Traction;Balance training;Passive range of motion;Taping;Energy conservation    PT Next Visit Plan  Continue trigger point dry needling, manual STM, joint mobs, suboccipital release; lower cervical spine mobility; deep cervical flexor strengthening/endurance; postural strengthening; do not tape anymore as pt has allergy to adhesive AEB skin irritation    PT Home Exercise Plan  3/28: educated pt to continue previously prescribed HEP; 5/2: pec stretch in doorway    Consulted and Agree with Plan  of Care  Patient       Patient will benefit from skilled therapeutic intervention in order to improve the following deficits and impairments:  Decreased activity tolerance, Decreased range of motion, Hypomobility, Impaired flexibility, Postural dysfunction, Impaired sensation, Pain, Decreased strength, Increased fascial restricitons, Increased muscle spasms  Visit Diagnosis: Abnormal posture  Cervicalgia  Chronic tension-type headache, intractable     Problem List Patient Active Problem List   Diagnosis Date Noted  . Cervical strain 06/04/2016  . Cervicogenic headache 06/04/2016        Geraldine Solar PT, DPT  [facility] [address] [place], [place], [address] Phone: [phone]   Fax:  [phone]  Name: Afsana Liera MRN: [id number] Date of Birth: 1971-10-13

## 2018-03-30 ENCOUNTER — Encounter (HOSPITAL_COMMUNITY): Payer: Self-pay

## 2018-03-30 ENCOUNTER — Ambulatory Visit (HOSPITAL_COMMUNITY): Payer: No Typology Code available for payment source

## 2018-03-30 DIAGNOSIS — M542 Cervicalgia: Secondary | ICD-10-CM

## 2018-03-30 DIAGNOSIS — R293 Abnormal posture: Secondary | ICD-10-CM | POA: Diagnosis not present

## 2018-03-30 DIAGNOSIS — G44221 Chronic tension-type headache, intractable: Secondary | ICD-10-CM

## 2018-03-30 NOTE — Therapy (Signed)
[place], [place], [address] Phone: [phone]   Fax:  [phone]  Physical Therapy Treatment  Patient Details  Name: Kayla White MRN: [id number] Date of Birth: 09-29-1971 Referring Provider: Margette Fast, MD   Encounter Date: 03/30/2018  PT End of Session - 03/30/18 1309    Visit Number  6    Number of Visits  16    Date for PT Re-Evaluation  04/23/18 reassess 04/02/2018    Authorization Type  Self-Pay; Medicaid? Per pt "in transition" for Medicaid -- Pt approved from Medicaid for 12 visits 03/16/18 to 04/26/18    Authorization Time Period  02/19/18 to 03/12/18; NEW: 03/12/18 to 04/23/18    Authorization - Visit Number  2    Authorization - Number of Visits  12    PT Start Time  1309 pt checked in late    PT Stop Time  1345    PT Time Calculation (min)  36 min    Activity Tolerance  Patient tolerated treatment well    Behavior During Therapy  [facility] for tasks assessed/performed       Past Medical History:  Diagnosis Date  . Depression   . IBS (irritable bowel syndrome)     Past Surgical History:  Procedure Laterality Date  . LAPAROSCOPIC ABDOMINAL EXPLORATION     R/o endometriosis    There were no vitals filed for this visit.  Subjective Assessment - 03/30/18 1309    Subjective  Pt reports that she currently has a HA and rates it about 2.5-3/10. She states that she felt better following last treatment session. She said that she felt good over most of the weekend until she drove to [place] and back.     How long can you sit comfortably?  15-20 mins    Diagnostic tests  3 x-rays: has shown loss of natural cervical curve; per pt upper cervical spinal curve is coming back but the lower cervical curve is not    Currently in Pain?  Yes    Pain Score  3     Pain Location  Neck    Pain Orientation  Anterior;Posterior    Pain Descriptors / Indicators  Aching;Dull    Pain Type  Chronic pain    Pain  Onset  More than a month ago    Pain Frequency  Intermittent    Aggravating Factors   sitting still looking at something    Pain Relieving Factors  lying on ice pack    Effect of Pain on Daily Activities  increases            [facility] Adult PT Treatment/Exercise - 03/30/18 0001      Neck Exercises: Theraband   Scapula Retraction  10 reps;Red;Limitations    Scapula Retraction Limitations  2 sets    Shoulder Extension  10 reps;Red    Shoulder Extension Limitations  2 sets    Rows  10 reps;Red    Rows Limitations  2 sets    Other Theraband Exercises  chest press/flies with RTB x10, GTB x10      Neck Exercises: Standing   Other Standing Exercises  Y's on wall with liftoff x20      Neck Exercises: Stretches   Other Neck Stretches  pec stretch in doorway 3x30"    Other Neck Stretches  pec stretch on 1/2 foam roll + MFR release to pec minor 1x60" bilaterally       Trigger Point Dry Needling -  03/30/18 1335    Consent Given?  Yes    Education Handout Provided  No    Muscles Treated Upper Body  Upper trapezius;Pectoralis major;Pectoralis minor;Levator scapulae    Upper Trapezius Response  Twitch reponse elicited;Palpable increased muscle length    Pectoralis Major Response  Twitch response elicited;Palpable increased muscle length    Pectoralis Minor Response  Twitch response elicited;Palpable increased muscle length    Levator Scapulae Response  Twitch response elicited;Palpable increased muscle length           PT Short Term Goals - 03/12/18 1036      PT SHORT TERM GOAL #1   Title  Pt will be independent with initial HEP in order to indicate improved mobility and decreased pain/headaches.    Baseline  dependent    Time  3    Period  Weeks    Status  Achieved      PT SHORT TERM GOAL #2   Title  Pt will report decrease in headaches by 25% in order to indicate decrease muscle tension and improved cervical mobility.      Baseline  4/18: HA are better, but not by 25%; still  reports headaches daily, increases as day goes on    Time  3    Period  Weeks    Status  On-going      PT SHORT TERM GOAL #3   Title  Pt will improve cervical ROM by 5 deg in order to indicate improved functional cervical mobility.      Baseline  4/18: flexion 38, ext 35, R LF 25, L LF 27, R rotation 78, L rotation 54    Time  3    Period  Weeks    Status  On-going      PT SHORT TERM GOAL #4   Title  Pt will be able to perform neck flexor muscle endurance test for 10 secs in order to indicate improved deep cervical flexor strength.      Baseline  4/18: 6 sec, almost instantly loses chin tuck and has increased pain    Time  3    Period  Weeks    Status  On-going      PT SHORT TERM GOAL #5   Title  Pt will report no more than 7/10 pain at worst with functional mobility in order to indicate improved cervical mobility and strength.     Baseline  4/18: last Thursday was a 10/10 but average 5-6/10    Time  3    Period  Weeks    Status  Partially Met        PT Long Term Goals - 03/12/18 1039      PT LONG TERM GOAL #1   Title  Pt will be independent with final HEP in order to indicate improved mobility and decreased pain/headaches.    Time  9    Period  Weeks    Status  On-going      PT LONG TERM GOAL #2   Title  Pt will report decrease in headaches by 50% in order to indicate decreased muscle tension and improved cervical mobility.      Baseline  4/18: HA are better, but not by 25%; still reports headaches daily, increases as day goes on    Time  9    Period  Weeks    Status  On-going      PT LONG TERM GOAL #3   Title  Pt will improve cervical  ROM (except L rotation) by 10 deg from baseline in order to indicate improved functional mobility.     Baseline  3/28: flexion 38, ext 35, R LF 25, L LF 27, R rotation 78, L rotation 54    Time  9    Period  Weeks    Status  On-going      PT LONG TERM GOAL #4   Title  Pt will report no more than 5/10 pain at worst in order to  indicate improved cervical mobility and strength.     Baseline  4/18: last Thursday was a 10/10 but average 5-6/10    Time  9    Period  Weeks    Status  On-going      PT LONG TERM GOAL #5   Title  Pt will perform neck flexor endurance test x 20 secs in order to indicate improved deep cervical flexor strength.      Baseline  4/18: 6 sec, almost instantly loses chin tuck and has increased pain    Time  9    Period  Weeks    Status  On-going      PT LONG TERM GOAL #6   Title  Pt will verbalize return to leisure activities (working out, riding bike at gym) in order to indicate decrease pain and headaches limiting activity.      Baseline  4/18: is not currently doing any leisure activity due to pain and headaches.     Time  9    Period  Weeks    Status  On-going            Plan - 03/30/18 1435    Clinical Impression Statement  Pt making steady progress towards goals. She continues to present to therapy with an average 3/10 HA and reporting positive effects from techniques performed in therapy. She continues with multiple taut bands in bil upper trap, levator scap and bil pec major & minor. Pt agreeable to trigger point dry needling again. Added in needling to the pecs to improve pt's posture and decrease her forward rounded shoulders. Followed up needling with muscle stretching and activation. Pt reporting slightly increased HA at EOS but PT attributing this to her cervical and postural muscular weakness. Pt leaving for [place] for a week at the end of this week so PT recommends providing pt with postural strengthening and stretching so that pt can self-manage symptoms while on her trip and continue to decrease overall pain.     Rehab Potential  Good    PT Frequency  1x / week    PT Duration  3 weeks    PT Treatment/Interventions  ADLs/Self Care Home Management;Electrical Stimulation;Ultrasound;Therapeutic activities;Therapeutic exercise;Neuromuscular re-education;Patient/family  education;Manual techniques;Dry needling;Cryotherapy;Moist Heat;Traction;Balance training;Passive range of motion;Taping;Energy conservation    PT Next Visit Plan  continue postural strengthening and progress as tolerated (provide handout for HEP), pec stretching and release; Continue trigger point dry needling, manual STM, joint mobs, suboccipital release; lower cervical spine mobility; deep cervical flexor strengthening/endurance; do not tape anymore as pt has allergy to adhesive AEB skin irritation    PT Home Exercise Plan  3/28: educated pt to continue previously prescribed HEP; 5/2: pec stretch in doorway    Consulted and Agree with Plan of Care  Patient       Patient will benefit from skilled therapeutic intervention in order to improve the following deficits and impairments:  Decreased activity tolerance, Decreased range of motion, Hypomobility, Impaired flexibility, Postural dysfunction, Impaired sensation, Pain,  Decreased strength, Increased fascial restricitons, Increased muscle spasms  Visit Diagnosis: Abnormal posture  Cervicalgia  Chronic tension-type headache, intractable     Problem List Patient Active Problem List   Diagnosis Date Noted  . Cervical strain 06/04/2016  . Cervicogenic headache 06/04/2016       Geraldine Solar PT, DPT  [facility] [address] [place], [place], [address] Phone: [phone]   Fax:  [phone]  Name: Leylany Nored MRN: [id number] Date of Birth: 04/30/1971

## 2018-04-01 ENCOUNTER — Ambulatory Visit (HOSPITAL_COMMUNITY): Payer: No Typology Code available for payment source

## 2018-04-01 ENCOUNTER — Telehealth (HOSPITAL_COMMUNITY): Payer: Self-pay | Admitting: Family Medicine

## 2018-04-01 NOTE — Telephone Encounter (Signed)
04/01/18  pt left a message that she has two kids at home sick with strep throat ... she will call later to reschedule

## 2018-04-15 ENCOUNTER — Telehealth: Payer: Self-pay | Admitting: *Deleted

## 2018-04-15 ENCOUNTER — Ambulatory Visit: Payer: BLUE CROSS/BLUE SHIELD | Admitting: Adult Health

## 2018-04-15 NOTE — Telephone Encounter (Signed)
Patient was no show for follow up with NP today.  

## 2018-04-16 ENCOUNTER — Encounter: Payer: Self-pay | Admitting: Adult Health

## 2018-04-21 ENCOUNTER — Encounter: Payer: Self-pay | Admitting: Internal Medicine

## 2018-05-14 ENCOUNTER — Ambulatory Visit: Payer: Self-pay | Admitting: Gastroenterology

## 2018-06-18 ENCOUNTER — Encounter: Payer: Self-pay | Admitting: Gastroenterology

## 2018-06-25 HISTORY — PX: COLONOSCOPY WITH ESOPHAGOGASTRODUODENOSCOPY (EGD): SHX5779

## 2018-07-09 ENCOUNTER — Encounter: Payer: Self-pay | Admitting: *Deleted

## 2018-07-09 ENCOUNTER — Encounter: Payer: Self-pay | Admitting: Gastroenterology

## 2018-07-09 ENCOUNTER — Encounter

## 2018-07-09 ENCOUNTER — Ambulatory Visit (INDEPENDENT_AMBULATORY_CARE_PROVIDER_SITE_OTHER): Payer: Managed Care, Other (non HMO) | Admitting: Gastroenterology

## 2018-07-09 ENCOUNTER — Telehealth: Payer: Self-pay | Admitting: *Deleted

## 2018-07-09 VITALS — BP 108/69 | HR 63 | Temp 97.0°F | Ht 69.0 in | Wt 169.0 lb

## 2018-07-09 DIAGNOSIS — K625 Hemorrhage of anus and rectum: Secondary | ICD-10-CM | POA: Diagnosis not present

## 2018-07-09 DIAGNOSIS — R1013 Epigastric pain: Secondary | ICD-10-CM

## 2018-07-09 MED ORDER — PEG 3350-KCL-NA BICARB-NACL 420 G PO SOLR: 4000.0000 mL | Freq: Once | ORAL | 0 refills | Status: AC

## 2018-07-09 NOTE — Progress Notes (Signed)
Primary Care Physician:  Caryl Bis, MD Primary Gastroenterologist:  Dr. Gala Romney   Chief Complaint  Patient presents with  . Abdominal Pain    mid upper abd; tested +for H.Pylori  . Dysphagia    felt like food was getting stuck    HPI:   Kayla White is a 47 y.o. female presenting today at the request of Dr. Quillian Quince secondary to abdominal pain and dysphagia. She has never had a colonoscopy or upper endoscopy.   Symptom onset in April 2019. Burning in throat, upper chest. Started on ranitidine, worsening symptoms. Felt like food was getting stuck. Added omeprazole at that time. 3 weeks ago had HIDA scan. Blood work positive for H.pylori serology per patient. Amoxicillin, Flagyl, clarithromycin, but she did not take PPI with this as she didn't know she needed to. Was in ED with severe pain, nausea, dizziness on Sunday. Was given more antibiotics at hospital to treat H.pylori again and did H.pylori stool test on Monday. Negative for Cdiff and H.pylori per patient. Still waiting on stool culture and E.coli. Was having diarrhea the past few days but resolved now. Third day while on antibiotics had some bright red blood clots with diarrhea. With diarrhea on Monday and Tuesday. She would like a colonoscopy now due to rectal bleeding.   When bending over, epigastric pain, RUQ pain. Radiates to the back. Takes her breath pain. Last week of May was first onset of pain in abdomen, wrapping through to back. Staying away from spicy foods, chocolate. Eating soft foods. No weight loss. Abdomen feels bloated and distended. Vomiting last night but had a severe headache. Occasional NSAIDs. Certain foods will trigger her and feels like she is going to pass out. Happens every few months. White sauce has triggered. Chicken salad from chick fil a. Omeprazole in the mornings but taking it with food and not on an empty stomach.    Thus far, HIDA normal with EF 97%. No reproduction of symptoms. Aug 2019  gallbladder sludge. No stones. BPE tiny hiatal hernia, otherwise normal.   Past Medical History:  Diagnosis Date  . Depression   . IBS (irritable bowel syndrome)   . Migraines     Past Surgical History:  Procedure Laterality Date  . IVF    . LAPAROSCOPIC ABDOMINAL EXPLORATION     R/o endometriosis    Current Outpatient Medications  Medication Sig Dispense Refill  . acetaminophen (TYLENOL) 500 MG tablet Take 500 mg by mouth every 6 (six) hours as needed.    . Aspirin-Acetaminophen-Caffeine (EXCEDRIN MIGRAINE PO) Take by mouth as needed.    . Bromelains (BROMELAIN PO) Take 500 mg by mouth as needed.     Marland Kitchen omeprazole (PRILOSEC) 20 MG capsule Take 20 mg by mouth daily.    . Probiotic Product (PROBIOTIC PO) Take by mouth daily.    . ranitidine (ZANTAC) 150 MG tablet Take 150 mg by mouth 2 (two) times daily.    . sertraline (ZOLOFT) 100 MG tablet Take 100 mg by mouth daily.      No current facility-administered medications for this visit.     Allergies as of 07/09/2018 - Review Complete 07/09/2018  Allergen Reaction Noted  . Penicillins Rash 06/04/2016    Family History  Problem Relation Age of Onset  . Congestive Heart Failure Father   . Arthritis Mother   . Hypertension Mother   . Alzheimer's disease Paternal Grandmother   . Colon cancer Neg Hx   . Colon polyps Neg Hx  Social History   Socioeconomic History  . Marital status: Single    Spouse name: Not on file  . Number of children: 4  . Years of education: 23  . Highest education level: Not on file  Occupational History  . Occupation: Real Conservation officer, historic buildings  Social Needs  . Financial resource strain: Not on file  . Food insecurity:    Worry: Not on file    Inability: Not on file  . Transportation needs:    Medical: Not on file    Non-medical: Not on file  Tobacco Use  . Smoking status: Never Smoker  . Smokeless tobacco: Never Used  Substance and Sexual Activity  . Alcohol use: No    Alcohol/week: 0.0  standard drinks  . Drug use: No  . Sexual activity: Not on file  Lifestyle  . Physical activity:    Days per week: Not on file    Minutes per session: Not on file  . Stress: Not on file  Relationships  . Social connections:    Talks on phone: Not on file    Gets together: Not on file    Attends religious service: Not on file    Active member of club or organization: Not on file    Attends meetings of clubs or organizations: Not on file    Relationship status: Not on file  . Intimate partner violence:    Fear of current or ex partner: Not on file    Emotionally abused: Not on file    Physically abused: Not on file    Forced sexual activity: Not on file  Other Topics Concern  . Not on file  Social History Narrative   Lives at home w/ her husband and 4 children   Right-handed   Rare caffeine    Review of Systems: Gen: Denies any fever, chills, fatigue, weight loss, lack of appetite.  CV: Denies chest pain, heart palpitations, peripheral edema, syncope.  Resp: Denies shortness of breath at rest or with exertion. Denies wheezing or cough.  GI: see HPI  GU : Denies urinary burning, urinary frequency, urinary hesitancy MS: Denies joint pain, muscle weakness, cramps, or limitation of movement.  Derm: Denies rash, itching, dry skin Psych: Denies depression, anxiety, memory loss, and confusion Heme: see HPI   Physical Exam: BP 108/69   Pulse 63   Temp (!) 97 F (36.1 C) (Oral)   Ht _0  (1.753 m)   Wt 169 lb (76.7 kg)   LMP 06/30/2018 (Exact Date)   BMI 24.96 kg/m  General:   Alert and oriented. Pleasant and cooperative. Well-nourished and well-developed.  Head:  Normocephalic and atraumatic. Eyes:  Without icterus, sclera clear and conjunctiva pink.  Ears:  Normal auditory acuity. Nose:  No deformity, discharge,  or lesions. Mouth:  No deformity or lesions, oral mucosa pink.  Lungs:  Clear to auscultation bilaterally.  Heart:  S1, S2 present without murmurs  appreciated.  Abdomen:  +BS, soft, mild TTP upper abdomen and non-distended. No HSM noted. No guarding or rebound. No masses appreciated.  Rectal:  Deferred  Msk:  Symmetrical without gross deformities. Normal posture. Extremities:  Without edema. Neurologic:  Alert and  oriented x4 Psych:  Alert and cooperative. Normal mood and affect.  July 2019 HIDA scan: normal EF 97%. No pain during HIDA scan. Aug 2019 gallbladder sludge,July 2019 BPE tiny hiatal hernia, otherwise normal.   Aug 2019: ALT 60, AST 61, alk phos, 54, Tbili 0.2, albumin 4.6, lipase 73, Hgb  13.4, Hct 40,

## 2018-07-09 NOTE — Telephone Encounter (Signed)
Note completed 

## 2018-07-09 NOTE — Assessment & Plan Note (Signed)
Several episodes of low-volume hematochezia/clots during diarrhea episode while on antibiotics. Likely benign anorectal irritation. No prior colonoscopy. No FH colorectal cancer or polyps. Desires diagnostic colonoscopy at time of EGD.  Proceed with TCS with Dr. Jena Gaussourk in near future: the risks, benefits, and alternatives have been discussed with the patient in detail. The patient states understanding and desires to proceed.

## 2018-07-09 NOTE — Assessment & Plan Note (Signed)
47 year old female with several month history of abdominal pain, reflux, nausea, vague solid food dysphagia that has now lessened. Notes treatment of positive H.pylori serology with amoxicillin, flagyl, clarithromycin, but no PPI. Reportedly had stool antigen completed that was negative 2 weeks later, but this was while on PPI and recent antibiotics. No improvement in symptoms with Prilosec, and biliary etiology less likely as HIDA normal without reproduction of symptoms and no stones (although sludge was noted). Discussed that if EGD was negative, could consider referral to surgery.  Proceed with upper endoscopy +/- dilation due to reports of vague dysphagia in the near future with Dr. Jena Gaussourk. The risks, benefits, and alternatives have been discussed in detail with patient. They have stated understanding and desire to proceed.  Continue Prilosec but change to without food each morning (not taking correctly currently inadvertently) Avoid NSAIDs (she has been limiting these but does take intermittently) Consider Gen Surg referral As of note, mildly elevated transaminases (ALT 60, AST 61) during ED visit recently. Requesting outside records to compare. Will need to follow this

## 2018-07-09 NOTE — Patient Instructions (Signed)
Start taking omeprazole (Prilosec) 30 minutes before breakfast daily (make sure it is on an empty stomach). It works the best this way.  We have scheduled a colonoscopy, upper endoscopy, dilation with Dr. Jena Gaussourk in the near future.  Please let me know how you are doing in about a week. We may need to change omeprazole to something else or even increase it to twice a day.  It was a pleasure to see you today. I strive to create trusting relationships with patients to provide genuine, compassionate, and quality care. I value your feedback. If you receive a survey regarding your visit,  I greatly appreciate you taking time to fill this out.   Gelene MinkAnna W. Edee Nifong, PhD, ANP-BC [facility]

## 2018-07-09 NOTE — Telephone Encounter (Signed)
Clinicals faxed

## 2018-07-09 NOTE — Telephone Encounter (Signed)
Called patient insurance and PA is required for TCS/EGD/+/-DIL. Need to fax records to [phone]. Case ref # [id number]. Tobi Bastosnna, wants OV note is done please let me know so I can fax these records. Thanks

## 2018-07-09 NOTE — Progress Notes (Signed)
cc'ed to pcp °

## 2018-07-10 NOTE — Telephone Encounter (Signed)
Received fax from [employer], TCS/EGD/-/+DIL approved. Ref# [id number], 07/09/18-10/09/18.

## 2018-07-23 ENCOUNTER — Telehealth: Payer: Self-pay | Admitting: *Deleted

## 2018-07-23 NOTE — Telephone Encounter (Signed)
Patient called to inform us she had TCS/[facility] done [facility] on 07/17/18 ordered by Dr. Garner Nashaniels. I called endo and cancelled procedure scheduled with [facility] on 07/29/18. FYI to AB. She is going to drop the report off here to be reviewed.

## 2018-07-28 NOTE — Telephone Encounter (Signed)
Has report been dropped off yet?

## 2018-07-29 ENCOUNTER — Encounter (HOSPITAL_COMMUNITY): Payer: Self-pay

## 2018-07-29 ENCOUNTER — Ambulatory Visit (HOSPITAL_COMMUNITY): Admit: 2018-07-29 | Payer: No Typology Code available for payment source | Admitting: Internal Medicine

## 2018-07-29 SURGERY — COLONOSCOPY
Anesthesia: Moderate Sedation

## 2018-07-29 NOTE — Telephone Encounter (Signed)
Kayla White/Kayla White, has patient dropped any reports off?

## 2018-07-29 NOTE — Telephone Encounter (Signed)
PATIENT HAS NOT DROPPED OFF ANY PAPERS

## 2018-07-30 ENCOUNTER — Telehealth: Payer: Self-pay | Admitting: Gastroenterology

## 2018-07-30 NOTE — Telephone Encounter (Signed)
Spoke with pt. Pt is doing better on Omeprazole. And the bleed has improved some. Pt wants to make sure that AB read everything on the report for her peace of mind. Pt said she wish she would've had everything done in our office.

## 2018-07-30 NOTE — Telephone Encounter (Signed)
Lmom, waiting on a return call.  

## 2018-07-30 NOTE — Telephone Encounter (Signed)
PATIENT DROPPED OFF RESULTS FROM PRIOR PROCEDURES AND THEY WERE PLACED IN YOUR CHAIR.

## 2018-07-30 NOTE — Telephone Encounter (Signed)
EGD 07/21/18 by Dr. Gabriel Cirri normal. Colonoscopy normal but did have Grade 1 hemorrhoids.   I don't see any biopsies taken. She was evaluated in the office as a new patient on 8/15 and had been scheduled for TCS/EGD 07/29/18. She saw Dr. Gabriel Cirri on 07/14/18 and completed the colonoscopy/EGD 8/27, just a few days prior to when we could perform.   How is she doing now since taking Prilosec on an empty stomach? We can offer a follow-up visit here if she wants to continue care here. If continues to have rectal bleeding, could consider hemorrhoid banding (10/31).

## 2018-07-31 NOTE — Telephone Encounter (Signed)
Lmom, waiting on a return call.  

## 2018-07-31 NOTE — Telephone Encounter (Signed)
I attempted to call back but she wasn't available on home phone and it was poor connection on cell.   I reviewed everything in detail. If she wants to talk to me, that's fine. If the rectal bleeding is continuing or any itching, burning, etc., please let me know. I can talk to her about banding and supportive measures in meantime.

## 2018-08-06 ENCOUNTER — Ambulatory Visit (INDEPENDENT_AMBULATORY_CARE_PROVIDER_SITE_OTHER): Payer: Managed Care, Other (non HMO) | Admitting: General Surgery

## 2018-08-06 ENCOUNTER — Encounter (HOSPITAL_COMMUNITY): Payer: Self-pay

## 2018-08-06 ENCOUNTER — Encounter: Payer: Self-pay | Admitting: General Surgery

## 2018-08-06 VITALS — BP 109/64 | HR 65 | Temp 97.7°F | Resp 16 | Wt 168.0 lb

## 2018-08-06 DIAGNOSIS — K828 Other specified diseases of gallbladder: Secondary | ICD-10-CM

## 2018-08-06 NOTE — Therapy (Signed)
[facility] [address] [place], [place], [address] Phone: [phone]   Fax:  [phone]  Patient Details  Name: Corey Caulfield MRN: [id number] Date of Birth: 1971/08/12 Referring Provider:  No ref. provider found  Encounter Date: 08/06/2018  PHYSICAL THERAPY DISCHARGE SUMMARY  Visits from Start of Care: 6  Current functional level related to goals / functional outcomes: See last note   Remaining deficits: See last note   Education / Equipment: n/a  Plan: Patient agrees to discharge.  Patient goals were not met. Patient is being discharged due to not returning since the last visit.  ?????     Geraldine Solar PT, [facility] [address] [place], [place], [address] Phone: [phone]   Fax:  [phone]

## 2018-08-06 NOTE — Progress Notes (Signed)
Kayla White; [id number]; 08/27/1971   HPI Patient is a 47-year-old white female who was referred to my care for evaluation treatment of biliary sludge.  She has had extensive work-up over the past 4 months concerning reflux, dysphasia, epigastric pain and bloating, and right upper quadrant abdominal discomfort.  Barium swallow, EGD, colonoscopy, ultrasound gallbladder, and HIDA scan have all been performed.  She is also been treated for H. pylori infection.  She states that she had times spontaneously has epigastric pain and bloating which seems to go to the right side of her abdomen.  She did have a HIDA scan which showed a normal gallbladder ejection fraction and no reproducible symptoms.  She recently had an ultrasound gallbladder which showed biliary sludge.  She is concerned that she continues to have intermittent symptoms and no medical treatment has been helpful.  She currently has 0 out of 10 abdominal pain.  No fever, chills, jaundice have been noted. Past Medical History:  Diagnosis Date  . Depression   . IBS (irritable bowel syndrome)   . Migraines     Past Surgical History:  Procedure Laterality Date  . IVF    . LAPAROSCOPIC ABDOMINAL EXPLORATION     R/o endometriosis    Family History  Problem Relation Age of Onset  . Congestive Heart Failure Father   . Arthritis Mother   . Hypertension Mother   . Alzheimer's disease Paternal Grandmother   . Colon cancer Neg Hx   . Colon polyps Neg Hx     Current Outpatient Medications on File Prior to Visit  Medication Sig Dispense Refill  . acetaminophen (TYLENOL) 500 MG tablet Take 500 mg by mouth every 6 (six) hours as needed.    . Aspirin-Acetaminophen-Caffeine (EXCEDRIN MIGRAINE PO) Take by mouth as needed.    . omeprazole (PRILOSEC) 20 MG capsule Take 20 mg by mouth daily.    . Probiotic Product (PROBIOTIC PO) Take by mouth daily.    . ranitidine (ZANTAC) 150 MG tablet Take 150 mg by mouth 2 (two) times daily.    .  sertraline (ZOLOFT) 100 MG tablet Take 100 mg by mouth daily.      No current facility-administered medications on file prior to visit.     Allergies  Allergen Reactions  . Beeswax   . Penicillins Rash    Social History   Substance and Sexual Activity  Alcohol Use No  . Alcohol/week: 0.0 standard drinks    Social History   Tobacco Use  Smoking Status Never Smoker  Smokeless Tobacco Never Used    Review of Systems  Constitutional: Negative.   HENT: Negative.   Eyes: Negative.   Respiratory: Negative.   Cardiovascular: Negative.   Gastrointestinal: Positive for abdominal pain and heartburn.  Genitourinary: Negative.   Musculoskeletal: Positive for joint pain and neck pain.  Skin: Negative.   Neurological: Negative.   Endo/Heme/Allergies: Negative.   Psychiatric/Behavioral: Negative.     Objective   Vitals:   08/06/18 1357  BP: 109/64  Pulse: 65  Resp: 16  Temp: 97.7 F (36.5 C)    Physical Exam  Constitutional: She is oriented to person, place, and time. She appears well-developed and well-nourished. No distress.  HENT:  Head: Normocephalic and atraumatic.  Eyes: No scleral icterus.  Cardiovascular: Normal rate, regular rhythm, normal heart sounds and intact distal pulses. Exam reveals no gallop.  No murmur heard. Pulmonary/Chest: Effort normal and breath sounds normal. No stridor. No respiratory distress. She has no wheezes. She has no rhonchi.   She has no rales.  Abdominal: Soft. Normal appearance and bowel sounds are normal. She exhibits no distension and no mass. There is no hepatosplenomegaly. There is no tenderness. There is negative Murphy's sign.  Neurological: She is alert and oriented to person, place, and time.  Skin: Skin is warm and dry.  Vitals reviewed. Dr. Daniel's notes reviewed.  Multiple radiology test reports reviewed  Assessment  Gallbladder sludge Plan   As the patient is feeling any medical therapy, laparoscopic cholecystectomy  is warranted.  The patient realizes that this may not totally treat her constellation of symptoms.  She is agreeable to proceeding with cholecystectomy.  The risks and benefits of the procedure including bleeding, infection, hepatobiliary injury, and the possibility of an open procedure were fully explained to the patient, who gave informed consent.  Surgery is scheduled for 08/14/2018. 

## 2018-08-06 NOTE — Patient Instructions (Signed)

## 2018-08-06 NOTE — H&P (Signed)
Kayla White; [id number]; 02/02/1971   HPI Patient is a 47 year old White female who was referred to my care for evaluation treatment of biliary sludge.  She has had extensive work-up over the past 4 months concerning reflux, dysphasia, epigastric pain and bloating, and right upper quadrant abdominal discomfort.  Barium swallow, EGD, colonoscopy, ultrasound gallbladder, and HIDA scan have all been performed.  She is also been treated for H. pylori infection.  She states that she had times spontaneously has epigastric pain and bloating which seems to go to the right side of her abdomen.  She did have a HIDA scan which showed a normal gallbladder ejection fraction and no reproducible symptoms.  She recently had an ultrasound gallbladder which showed biliary sludge.  She is concerned that she continues to have intermittent symptoms and no medical treatment has been helpful.  She currently has 0 out of 10 abdominal pain.  No fever, chills, jaundice have been noted. Past Medical History:  Diagnosis Date  . Depression   . IBS (irritable bowel syndrome)   . Migraines     Past Surgical History:  Procedure Laterality Date  . IVF    . LAPAROSCOPIC ABDOMINAL EXPLORATION     R/o endometriosis    Family History  Problem Relation Age of Onset  . Congestive Heart Failure Father   . Arthritis Mother   . Hypertension Mother   . Alzheimer's disease Paternal Grandmother   . Colon cancer Neg Hx   . Colon polyps Neg Hx     Current Outpatient Medications on File Prior to Visit  Medication Sig Dispense Refill  . acetaminophen (TYLENOL) 500 MG tablet Take 500 mg by mouth every 6 (six) hours as needed.    . Aspirin-Acetaminophen-Caffeine (EXCEDRIN MIGRAINE PO) Take by mouth as needed.    Marland Kitchen. omeprazole (PRILOSEC) 20 MG capsule Take 20 mg by mouth daily.    . Probiotic Product (PROBIOTIC PO) Take by mouth daily.    . ranitidine (ZANTAC) 150 MG tablet Take 150 mg by mouth 2 (two) times daily.    .  sertraline (ZOLOFT) 100 MG tablet Take 100 mg by mouth daily.      No current facility-administered medications on file prior to visit.     Allergies  Allergen Reactions  . Beeswax   . Penicillins Rash    Social History   Substance and Sexual Activity  Alcohol Use No  . Alcohol/week: 0.0 standard drinks    Social History   Tobacco Use  Smoking Status Never Smoker  Smokeless Tobacco Never Used    Review of Systems  Constitutional: Negative.   HENT: Negative.   Eyes: Negative.   Respiratory: Negative.   Cardiovascular: Negative.   Gastrointestinal: Positive for abdominal pain and heartburn.  Genitourinary: Negative.   Musculoskeletal: Positive for joint pain and neck pain.  Skin: Negative.   Neurological: Negative.   Endo/Heme/Allergies: Negative.   Psychiatric/Behavioral: Negative.     Objective   Vitals:   08/06/18 1357  BP: 109/64  Pulse: 65  Resp: 16  Temp: 97.7 F (36.5 C)    Physical Exam  Constitutional: She is oriented to person, place, and time. She appears well-developed and well-nourished. No distress.  HENT:  Head: Normocephalic and atraumatic.  Eyes: No scleral icterus.  Cardiovascular: Normal rate, regular rhythm, normal heart sounds and intact distal pulses. Exam reveals no gallop.  No murmur heard. Pulmonary/Chest: Effort normal and breath sounds normal. No stridor. No respiratory distress. She has no wheezes. She has no rhonchi.  She has no rales.  Abdominal: Soft. Normal appearance and bowel sounds are normal. She exhibits no distension and no mass. There is no hepatosplenomegaly. There is no tenderness. There is negative Murphy's sign.  Neurological: She is alert and oriented to person, place, and time.  Skin: Skin is warm and dry.  Vitals reviewed. Dr. Rosann Auerbach notes reviewed.  Multiple radiology test reports reviewed  Assessment  Gallbladder sludge Plan   As the patient is feeling any medical therapy, laparoscopic cholecystectomy  is warranted.  The patient realizes that this may not totally treat her constellation of symptoms.  She is agreeable to proceeding with cholecystectomy.  The risks and benefits of the procedure including bleeding, infection, hepatobiliary injury, and the possibility of an open procedure were fully explained to the patient, who gave informed consent.  Surgery is scheduled for 08/14/2018.

## 2018-08-10 ENCOUNTER — Other Ambulatory Visit (HOSPITAL_COMMUNITY): Payer: Self-pay

## 2018-08-10 NOTE — Telephone Encounter (Signed)
Spoke with pt. She saw Dr. Lovell SheehanJenkins last week. Pt is scheduled to have her gallbladder removed this Friday September 08/07/18. Pt said her PCP, Dr. Lovell SheehanJenkins and another doctor felt like this is the best route for her and it may help the symptoms she's been having. Pt will contact AB if needed.

## 2018-08-11 NOTE — Patient Instructions (Signed)
Kayla White  08/11/2018     @PREFPERIOPPHARMACY @   Your procedure is scheduled on  08/14/2018 .  Report to [facility] at  615   A.M.  Call this number if you have problems the morning of surgery:  [phone]   Remember:  Do not eat or drink after midnight.  You may drink clear liquids until  12 midnight 08/13/2018 .  Clear liquids allowed are:                    Water, Juice (non-citric and without pulp), Carbonated beverages, Clear Tea, Black Coffee only, Plain Jell-O only, Gatorade and Plain Popsicles only    Take these medicines the morning of surgery with A SIP OF WATER None    Do not wear jewelry, make-up or nail polish.  Do not wear lotions, powders, or perfumes, or deodorant.  Do not shave 48 hours prior to surgery.  Men may shave face and neck.  Do not bring valuables to the hospital.  [facility] is not responsible for any belongings or valuables.  Contacts, dentures or bridgework may not be worn into surgery.  Leave your suitcase in the car.  After surgery it may be brought to your room.  For patients admitted to the hospital, discharge time will be determined by your treatment team.  Patients discharged the day of surgery will not be allowed to drive home.   Name and phone number of your driver:   family Special instructions:  None  Please read over the following fact sheets that you were given. Anesthesia Post-op Instructions and Care and Recovery After Surgery       Laparoscopic Cholecystectomy Laparoscopic cholecystectomy is surgery to remove the gallbladder. The gallbladder is a pear-shaped organ that lies beneath the liver on the right side of the body. The gallbladder stores bile, which is a fluid that helps the body to digest fats. Cholecystectomy is often done for inflammation of the gallbladder (cholecystitis). This condition is usually caused by a buildup of gallstones (cholelithiasis) in the gallbladder. Gallstones can block  the flow of bile, which can result in inflammation and pain. In severe cases, emergency surgery may be required. This procedure is done though small incisions in your abdomen (laparoscopic surgery). A thin scope with a camera (laparoscope) is inserted through one incision. Thin surgical instruments are inserted through the other incisions. In some cases, a laparoscopic procedure may be turned into a type of surgery that is done through a larger incision (open surgery). Tell a health care provider about:  Any allergies you have.  All medicines you are taking, including vitamins, herbs, eye drops, creams, and over-the-counter medicines.  Any problems you or family members have had with anesthetic medicines.  Any blood disorders you have.  Any surgeries you have had.  Any medical conditions you have.  Whether you are pregnant or may be pregnant. What are the risks? Generally, this is a safe procedure. However, problems may occur, including:  Infection.  Bleeding.  Allergic reactions to medicines.  Damage to other structures or organs.  A stone remaining in the common bile duct. The common bile duct carries bile from the gallbladder into the small intestine.  A bile leak from the cyst duct that is clipped when your gallbladder is removed.  What happens before the procedure? Staying hydrated Follow instructions from your health care provider about hydration, which may include:  Up to 2 hours  before the procedure - you may continue to drink clear liquids, such as water, clear fruit juice, black coffee, and plain tea.  Eating and drinking restrictions Follow instructions from your health care provider about eating and drinking, which may include:  8 hours before the procedure - stop eating heavy meals or foods such as meat, fried foods, or fatty foods.  6 hours before the procedure - stop eating light meals or foods, such as toast or cereal.  6 hours before the procedure - stop  drinking milk or drinks that contain milk.  2 hours before the procedure - stop drinking clear liquids.  Medicines  Ask your health care provider about: ? Changing or stopping your regular medicines. This is especially important if you are taking diabetes medicines or blood thinners. ? Taking medicines such as aspirin and ibuprofen. These medicines can thin your blood. Do not take these medicines before your procedure if your health care provider instructs you not to.  You may be given antibiotic medicine to help prevent infection. General instructions  Let your health care provider know if you develop a cold or an infection before surgery.  Plan to have someone take you home from the hospital or clinic.  Ask your health care provider how your surgical site will be marked or identified. What happens during the procedure?  To reduce your risk of infection: ? Your health care team will wash or sanitize their hands. ? Your skin will be washed with soap. ? Hair may be removed from the surgical area.  An IV tube may be inserted into one of your veins.  You will be given one or more of the following: ? A medicine to help you relax (sedative). ? A medicine to make you fall asleep (general anesthetic).  A breathing tube will be placed in your mouth.  Your surgeon will make several small cuts (incisions) in your abdomen.  The laparoscope will be inserted through one of the small incisions. The camera on the laparoscope will send images to a TV screen (monitor) in the operating room. This lets your surgeon see inside your abdomen.  Air-like gas will be pumped into your abdomen. This will expand your abdomen to give the surgeon more room to perform the surgery.  Other tools that are needed for the procedure will be inserted through the other incisions. The gallbladder will be removed through one of the incisions.  Your common bile duct may be examined. If stones are found in the common  bile duct, they may be removed.  After your gallbladder has been removed, the incisions will be closed with stitches (sutures), staples, or skin glue.  Your incisions may be covered with a bandage (dressing). The procedure may vary among health care providers and hospitals. What happens after the procedure?  Your blood pressure, heart rate, breathing rate, and blood oxygen level will be monitored until the medicines you were given have worn off.  You will be given medicines as needed to control your pain.  Do not drive for 24 hours if you were given a sedative. This information is not intended to replace advice given to you by your health care provider. Make sure you discuss any questions you have with your health care provider. Document Released: 11/11/2005 Document Revised: 06/02/2016 Document Reviewed: 04/29/2016 Elsevier Interactive Patient Education  2018 [employer].  Laparoscopic Cholecystectomy, Care After This sheet gives you information about how to care for yourself after your procedure. Your health care provider may  also give you more specific instructions. If you have problems or questions, contact your health care provider. What can I expect after the procedure? After the procedure, it is common to have:  Pain at your incision sites. You will be given medicines to control this pain.  Mild nausea or vomiting.  Bloating and possible shoulder pain from the air-like gas that was used during the procedure.  Follow these instructions at home: Incision care   Follow instructions from your health care provider about how to take care of your incisions. Make sure you: ? Wash your hands with soap and water before you change your bandage (dressing). If soap and water are not available, use hand sanitizer. ? Change your dressing as told by your health care provider. ? Leave stitches (sutures), skin glue, or adhesive strips in place. These skin closures may need to be in place  for 2 weeks or longer. If adhesive strip edges start to loosen and curl up, you may trim the loose edges. Do not remove adhesive strips completely unless your health care provider tells you to do that.  Do not take baths, swim, or use a hot tub until your health care provider approves. Ask your health care provider if you can take showers. You may only be allowed to take sponge baths for bathing.  Check your incision area every day for signs of infection. Check for: ? More redness, swelling, or pain. ? More fluid or blood. ? Warmth. ? Pus or a bad smell. Activity  Do not drive or use heavy machinery while taking prescription pain medicine.  Do not lift anything that is heavier than 10 lb (4.5 kg) until your health care provider approves.  Do not play contact sports until your health care provider approves.  Do not drive for 24 hours if you were given a medicine to help you relax (sedative).  Rest as needed. Do not return to work or school until your health care provider approves. General instructions  Take over-the-counter and prescription medicines only as told by your health care provider.  To prevent or treat constipation while you are taking prescription pain medicine, your health care provider may recommend that you: ? Drink enough fluid to keep your urine clear or pale yellow. ? Take over-the-counter or prescription medicines. ? Eat foods that are high in fiber, such as fresh fruits and vegetables, whole grains, and beans. ? Limit foods that are high in fat and processed sugars, such as fried and sweet foods. Contact a health care provider if:  You develop a rash.  You have more redness, swelling, or pain around your incisions.  You have more fluid or blood coming from your incisions.  Your incisions feel warm to the touch.  You have pus or a bad smell coming from your incisions.  You have a fever.  One or more of your incisions breaks open. Get help right away  if:  You have trouble breathing.  You have chest pain.  You have increasing pain in your shoulders.  You faint or feel dizzy when you stand.  You have severe pain in your abdomen.  You have nausea or vomiting that lasts for more than one day.  You have leg pain. This information is not intended to replace advice given to you by your health care provider. Make sure you discuss any questions you have with your health care provider. Document Released: 11/11/2005 Document Revised: 06/01/2016 Document Reviewed: 04/29/2016 Elsevier Interactive Patient Education  2018 Elsevier  Inc.  General Anesthesia, Adult General anesthesia is the use of medicines to make a person "go to sleep" (be unconscious) for a medical procedure. General anesthesia is often recommended when a procedure:  Is long.  Requires you to be still or in an unusual position.  Is major and can cause you to lose blood.  Is impossible to do without general anesthesia.  The medicines used for general anesthesia are called general anesthetics. In addition to making you sleep, the medicines:  Prevent pain.  Control your blood pressure.  Relax your muscles.  Tell a health care provider about:  Any allergies you have.  All medicines you are taking, including vitamins, herbs, eye drops, creams, and over-the-counter medicines.  Any problems you or family members have had with anesthetic medicines.  Types of anesthetics you have had in the past.  Any bleeding disorders you have.  Any surgeries you have had.  Any medical conditions you have.  Any history of heart or lung conditions, such as heart failure, sleep apnea, or chronic obstructive pulmonary disease (COPD).  Whether you are pregnant or may be pregnant.  Whether you use tobacco, alcohol, marijuana, or street drugs.  Any history of Financial planner.  Any history of depression or anxiety. What are the risks? Generally, this is a safe procedure.  However, problems may occur, including:  Allergic reaction to anesthetics.  Lung and heart problems.  Inhaling food or liquids from your stomach into your lungs (aspiration).  Injury to nerves.  Waking up during your procedure and being unable to move (rare).  Extreme agitation or a state of mental confusion (delirium) when you wake up from the anesthetic.  Air in the bloodstream, which can lead to stroke.  These problems are more likely to develop if you are having a major surgery or if you have an advanced medical condition. You can prevent some of these complications by answering all of your health care provider's questions thoroughly and by following all pre-procedure instructions. General anesthesia can cause side effects, including:  Nausea or vomiting  A sore throat from the breathing tube.  Feeling cold or shivery.  Feeling tired, washed out, or achy.  Sleepiness or drowsiness.  Confusion or agitation.  What happens before the procedure? Staying hydrated Follow instructions from your health care provider about hydration, which may include:  Up to 2 hours before the procedure - you may continue to drink clear liquids, such as water, clear fruit juice, black coffee, and plain tea.  Eating and drinking restrictions Follow instructions from your health care provider about eating and drinking, which may include:  8 hours before the procedure - stop eating heavy meals or foods such as meat, fried foods, or fatty foods.  6 hours before the procedure - stop eating light meals or foods, such as toast or cereal.  6 hours before the procedure - stop drinking milk or drinks that contain milk.  2 hours before the procedure - stop drinking clear liquids.  Medicines  Ask your health care provider about: ? Changing or stopping your regular medicines. This is especially important if you are taking diabetes medicines or blood thinners. ? Taking medicines such as aspirin and  ibuprofen. These medicines can thin your blood. Do not take these medicines before your procedure if your health care provider instructs you not to. ? Taking new dietary supplements or medicines. Do not take these during the week before your procedure unless your health care provider approves them.  If you  are told to take a medicine or to continue taking a medicine on the day of the procedure, take the medicine with sips of water. General instructions   Ask if you will be going home the same day, the following day, or after a longer hospital stay. ? Plan to have someone take you home. ? Plan to have someone stay with you for the first 24 hours after you leave the hospital or clinic.  For 3-6 weeks before the procedure, try not to use any tobacco products, such as cigarettes, chewing tobacco, and e-cigarettes.  You may brush your teeth on the morning of the procedure, but make sure to spit out the toothpaste. What happens during the procedure?  You will be given anesthetics through a mask and through an IV tube in one of your veins.  You may receive medicine to help you relax (sedative).  As soon as you are asleep, a breathing tube may be used to help you breathe.  An anesthesia specialist will stay with you throughout the procedure. He or she will help keep you comfortable and safe by continuing to give you medicines and adjusting the amount of medicine that you get. He or she will also watch your blood pressure, pulse, and oxygen levels to make sure that the anesthetics do not cause any problems.  If a breathing tube was used to help you breathe, it will be removed before you wake up. The procedure may vary among health care providers and hospitals. What happens after the procedure?  You will wake up, often slowly, after the procedure is complete, usually in a recovery area.  Your blood pressure, heart rate, breathing rate, and blood oxygen level will be monitored until the medicines  you were given have worn off.  You may be given medicine to help you calm down if you feel anxious or agitated.  If you will be going home the same day, your health care provider may check to make sure you can stand, drink, and urinate.  Your health care providers will treat your pain and side effects before you go home.  Do not drive for 24 hours if you received a sedative.  You may: ? Feel nauseous and vomit. ? Have a sore throat. ? Have mental slowness. ? Feel cold or shivery. ? Feel sleepy. ? Feel tired. ? Feel sore or achy, even in parts of your body where you did not have surgery. This information is not intended to replace advice given to you by your health care provider. Make sure you discuss any questions you have with your health care provider. Document Released: 02/18/2008 Document Revised: 04/23/2016 Document Reviewed: 10/26/2015 Elsevier Interactive Patient Education  2018 [employer]. General Anesthesia, Adult, Care After These instructions provide you with information about caring for yourself after your procedure. Your health care provider may also give you more specific instructions. Your treatment has been planned according to current medical practices, but problems sometimes occur. Call your health care provider if you have any problems or questions after your procedure. What can I expect after the procedure? After the procedure, it is common to have:  Vomiting.  A sore throat.  Mental slowness.  It is common to feel:  Nauseous.  Cold or shivery.  Sleepy.  Tired.  Sore or achy, even in parts of your body where you did not have surgery.  Follow these instructions at home: For at least 24 hours after the procedure:  Do not: ? Participate in activities  where you could fall or become injured. ? Drive. ? Use heavy machinery. ? Drink alcohol. ? Take sleeping pills or medicines that cause drowsiness. ? Make important decisions or sign legal  documents. ? Take care of children on your own.  Rest. Eating and drinking  If you vomit, drink water, juice, or soup when you can drink without vomiting.  Drink enough fluid to keep your urine clear or pale yellow.  Make sure you have little or no nausea before eating solid foods.  Follow the diet recommended by your health care provider. General instructions  Have a responsible adult stay with you until you are awake and alert.  Return to your normal activities as told by your health care provider. Ask your health care provider what activities are safe for you.  Take over-the-counter and prescription medicines only as told by your health care provider.  If you smoke, do not smoke without supervision.  Keep all follow-up visits as told by your health care provider. This is important. Contact a health care provider if:  You continue to have nausea or vomiting at home, and medicines are not helpful.  You cannot drink fluids or start eating again.  You cannot urinate after 8-12 hours.  You develop a skin rash.  You have fever.  You have increasing redness at the site of your procedure. Get help right away if:  You have difficulty breathing.  You have chest pain.  You have unexpected bleeding.  You feel that you are having a life-threatening or urgent problem. This information is not intended to replace advice given to you by your health care provider. Make sure you discuss any questions you have with your health care provider. Document Released: 02/17/2001 Document Revised: 04/15/2016 Document Reviewed: 10/26/2015 Elsevier Interactive Patient Education  [employer]2018 Elsevier Inc.

## 2018-08-13 ENCOUNTER — Other Ambulatory Visit: Payer: Self-pay

## 2018-08-13 ENCOUNTER — Encounter (HOSPITAL_COMMUNITY): Payer: Self-pay

## 2018-08-13 ENCOUNTER — Encounter (HOSPITAL_COMMUNITY)
Admission: RE | Admit: 2018-08-13 | Discharge: 2018-08-13 | Disposition: A | Discharge status: Home / Self Care | Payer: Managed Care, Other (non HMO) | Source: Ambulatory Visit | Attending: General Surgery | Admitting: General Surgery

## 2018-08-13 DIAGNOSIS — G43909 Migraine, unspecified, not intractable, without status migrainosus: Secondary | ICD-10-CM | POA: Diagnosis not present

## 2018-08-13 DIAGNOSIS — K219 Gastro-esophageal reflux disease without esophagitis: Secondary | ICD-10-CM | POA: Diagnosis not present

## 2018-08-13 DIAGNOSIS — Z880 Allergy status to penicillin: Secondary | ICD-10-CM | POA: Diagnosis not present

## 2018-08-13 DIAGNOSIS — K838 Other specified diseases of biliary tract: Secondary | ICD-10-CM | POA: Diagnosis not present

## 2018-08-13 DIAGNOSIS — F329 Major depressive disorder, single episode, unspecified: Secondary | ICD-10-CM | POA: Diagnosis not present

## 2018-08-13 DIAGNOSIS — Z8249 Family history of ischemic heart disease and other diseases of the circulatory system: Secondary | ICD-10-CM | POA: Diagnosis not present

## 2018-08-13 DIAGNOSIS — K589 Irritable bowel syndrome without diarrhea: Secondary | ICD-10-CM | POA: Diagnosis not present

## 2018-08-13 HISTORY — DX: Anxiety disorder, unspecified: F41.9

## 2018-08-13 LAB — PREGNANCY, URINE: Preg Test, Ur: NEGATIVE

## 2018-08-14 ENCOUNTER — Encounter (HOSPITAL_COMMUNITY): Payer: Self-pay | Admitting: Anesthesiology

## 2018-08-14 ENCOUNTER — Ambulatory Visit (HOSPITAL_COMMUNITY): Payer: Managed Care, Other (non HMO) | Admitting: Anesthesiology

## 2018-08-14 ENCOUNTER — Encounter (HOSPITAL_COMMUNITY)
Admission: RE | Disposition: A | Discharge status: Home / Self Care | Payer: Self-pay | Source: Ambulatory Visit | Attending: General Surgery

## 2018-08-14 ENCOUNTER — Ambulatory Visit (HOSPITAL_COMMUNITY)
Admission: RE | Admit: 2018-08-14 | Discharge: 2018-08-14 | Disposition: A | Discharge status: Home / Self Care | Payer: Managed Care, Other (non HMO) | Source: Ambulatory Visit | Attending: General Surgery | Admitting: General Surgery

## 2018-08-14 DIAGNOSIS — Z880 Allergy status to penicillin: Secondary | ICD-10-CM | POA: Insufficient documentation

## 2018-08-14 DIAGNOSIS — F329 Major depressive disorder, single episode, unspecified: Secondary | ICD-10-CM | POA: Insufficient documentation

## 2018-08-14 DIAGNOSIS — K589 Irritable bowel syndrome without diarrhea: Secondary | ICD-10-CM | POA: Insufficient documentation

## 2018-08-14 DIAGNOSIS — Z8249 Family history of ischemic heart disease and other diseases of the circulatory system: Secondary | ICD-10-CM | POA: Insufficient documentation

## 2018-08-14 DIAGNOSIS — K838 Other specified diseases of biliary tract: Secondary | ICD-10-CM | POA: Insufficient documentation

## 2018-08-14 DIAGNOSIS — G43909 Migraine, unspecified, not intractable, without status migrainosus: Secondary | ICD-10-CM | POA: Insufficient documentation

## 2018-08-14 DIAGNOSIS — K219 Gastro-esophageal reflux disease without esophagitis: Secondary | ICD-10-CM | POA: Insufficient documentation

## 2018-08-14 HISTORY — PX: CHOLECYSTECTOMY: SHX55

## 2018-08-14 SURGERY — LAPAROSCOPIC CHOLECYSTECTOMY
Anesthesia: General

## 2018-08-14 MED ORDER — HYDROCODONE-ACETAMINOPHEN 5-325 MG PO TABS: 1.0000 | ORAL_TABLET | ORAL | 0 refills | Status: DC | PRN

## 2018-08-14 MED ORDER — HYDROCODONE-ACETAMINOPHEN 7.5-325 MG PO TABS: 1.0000 | ORAL_TABLET | Freq: Once | ORAL | Status: DC | PRN

## 2018-08-14 MED ORDER — ATROPINE SULFATE 0.4 MG/ML IJ SOLN
INTRAMUSCULAR | Status: DC | PRN
  Administered 2018-08-14: 0.2 mg via INTRAVENOUS

## 2018-08-14 MED ORDER — MEPERIDINE HCL 50 MG/ML IJ SOLN: 6.2500 mg | INTRAMUSCULAR | Status: DC | PRN

## 2018-08-14 MED ORDER — CHLORHEXIDINE GLUCONATE CLOTH 2 % EX PADS: 6.0000 | MEDICATED_PAD | Freq: Once | CUTANEOUS | Status: DC

## 2018-08-14 MED ORDER — HYDROMORPHONE HCL 1 MG/ML IJ SOLN
0.2500 mg | INTRAMUSCULAR | Status: DC | PRN
  Administered 2018-08-14: 0.5 mg via INTRAVENOUS
  Filled 2018-08-14: qty 0.5

## 2018-08-14 MED ORDER — BUPIVACAINE LIPOSOME 1.3 % IJ SUSP
INTRAMUSCULAR | Status: AC
  Filled 2018-08-14: qty 20

## 2018-08-14 MED ORDER — CIPROFLOXACIN IN D5W 400 MG/200ML IV SOLN
400.0000 mg | INTRAVENOUS | Status: AC
  Administered 2018-08-14: 400 mg via INTRAVENOUS
  Filled 2018-08-14: qty 200

## 2018-08-14 MED ORDER — BACITRACIN-NEOMYCIN-POLYMYXIN 400-5-5000 EX OINT
TOPICAL_OINTMENT | CUTANEOUS | Status: DC | PRN
  Administered 2018-08-14: 1 via TOPICAL

## 2018-08-14 MED ORDER — ROCURONIUM BROMIDE 100 MG/10ML IV SOLN
INTRAVENOUS | Status: DC | PRN
  Administered 2018-08-14: 40 mg via INTRAVENOUS

## 2018-08-14 MED ORDER — KETOROLAC TROMETHAMINE 30 MG/ML IJ SOLN: 30.0000 mg | Freq: Once | INTRAMUSCULAR | Status: DC | PRN

## 2018-08-14 MED ORDER — SODIUM CHLORIDE 0.9 % IR SOLN
Status: DC | PRN
  Administered 2018-08-14: 1000 mL

## 2018-08-14 MED ORDER — GLYCOPYRROLATE 0.2 MG/ML IJ SOLN
INTRAMUSCULAR | Status: DC | PRN
  Administered 2018-08-14 (×2): 0.2 mg via INTRAVENOUS

## 2018-08-14 MED ORDER — SUGAMMADEX SODIUM 500 MG/5ML IV SOLN
INTRAVENOUS | Status: DC | PRN
  Administered 2018-08-14: 200 mg via INTRAVENOUS

## 2018-08-14 MED ORDER — BACITRACIN-NEOMYCIN-POLYMYXIN 400-5-5000 EX OINT
TOPICAL_OINTMENT | CUTANEOUS | Status: AC
  Filled 2018-08-14: qty 1

## 2018-08-14 MED ORDER — KETOROLAC TROMETHAMINE 30 MG/ML IJ SOLN
30.0000 mg | Freq: Once | INTRAMUSCULAR | Status: AC
  Administered 2018-08-14: 30 mg via INTRAVENOUS
  Filled 2018-08-14: qty 1

## 2018-08-14 MED ORDER — ONDANSETRON HCL 4 MG/2ML IJ SOLN: 4.0000 mg | Freq: Once | INTRAMUSCULAR | Status: DC | PRN

## 2018-08-14 MED ORDER — LIDOCAINE HCL (CARDIAC) PF 50 MG/5ML IV SOSY
PREFILLED_SYRINGE | INTRAVENOUS | Status: DC | PRN
  Administered 2018-08-14: 50 mg via INTRAVENOUS

## 2018-08-14 MED ORDER — LACTATED RINGERS IV SOLN
INTRAVENOUS | Status: DC
  Administered 2018-08-14: 11:00:00 via INTRAVENOUS
  Administered 2018-08-14: 1000 mL via INTRAVENOUS

## 2018-08-14 MED ORDER — FENTANYL CITRATE (PF) 100 MCG/2ML IJ SOLN
INTRAMUSCULAR | Status: DC | PRN
  Administered 2018-08-14: 25 ug via INTRAVENOUS
  Administered 2018-08-14 (×2): 50 ug via INTRAVENOUS
  Administered 2018-08-14: 25 ug via INTRAVENOUS

## 2018-08-14 MED ORDER — BUPIVACAINE LIPOSOME 1.3 % IJ SUSP
INTRAMUSCULAR | Status: DC | PRN
  Administered 2018-08-14: 14 mL

## 2018-08-14 MED ORDER — HEMOSTATIC AGENTS (NO CHARGE) OPTIME
TOPICAL | Status: DC | PRN
  Administered 2018-08-14: 1 via TOPICAL

## 2018-08-14 MED ORDER — PROPOFOL 10 MG/ML IV BOLUS
INTRAVENOUS | Status: DC | PRN
  Administered 2018-08-14: 160 mg via INTRAVENOUS

## 2018-08-14 MED ORDER — POVIDONE-IODINE 10 % EX OINT
TOPICAL_OINTMENT | CUTANEOUS | Status: AC
  Filled 2018-08-14: qty 1

## 2018-08-14 SURGICAL SUPPLY — 51 items
APPLIER CLIP ROT 10 11.4 M/L (STAPLE) ×2
APR CLP MED LRG 11.4X10 (STAPLE) ×1
BAG RETRIEVAL 10 (BASKET) ×1
CHLORAPREP W/TINT 26ML (MISCELLANEOUS) ×2 IMPLANT
CLIP APPLIE ROT 10 11.4 M/L (STAPLE) ×1 IMPLANT
CLOTH BEACON ORANGE TIMEOUT ST (SAFETY) ×2 IMPLANT
COVER LIGHT HANDLE STERIS (MISCELLANEOUS) ×4 IMPLANT
ELECT REM PT RETURN 9FT ADLT (ELECTROSURGICAL) ×2
ELECTRODE REM PT RTRN 9FT ADLT (ELECTROSURGICAL) ×1 IMPLANT
FILTER SMOKE EVAC LAPAROSHD (FILTER) ×2 IMPLANT
GAUZE SPONGE 4X4 12PLY STRL (GAUZE/BANDAGES/DRESSINGS) ×5 IMPLANT
GLOVE BIO SURGEON STRL SZ7 (GLOVE) ×1 IMPLANT
GLOVE BIOGEL PI IND STRL 6.5 (GLOVE) IMPLANT
GLOVE BIOGEL PI IND STRL 7.0 (GLOVE) ×1 IMPLANT
GLOVE BIOGEL PI INDICATOR 6.5 (GLOVE) ×1
GLOVE BIOGEL PI INDICATOR 7.0 (GLOVE) ×1
GLOVE SURG SS PI 6.5 STRL IVOR (GLOVE) ×1 IMPLANT
GLOVE SURG SS PI 7.0 STRL IVOR (GLOVE) ×1 IMPLANT
GLOVE SURG SS PI 7.5 STRL IVOR (GLOVE) ×2 IMPLANT
GOWN STRL REUS W/ TWL XL LVL3 (GOWN DISPOSABLE) ×1 IMPLANT
GOWN STRL REUS W/TWL LRG LVL3 (GOWN DISPOSABLE) ×4 IMPLANT
GOWN STRL REUS W/TWL XL LVL3 (GOWN DISPOSABLE) ×2
HEMOSTAT SNOW SURGICEL 2X4 (HEMOSTASIS) ×2 IMPLANT
INST SET LAPROSCOPIC AP (KITS) ×2 IMPLANT
IV NS IRRIG 3000ML ARTHROMATIC (IV SOLUTION) IMPLANT
KIT TURNOVER KIT A (KITS) ×2 IMPLANT
MANIFOLD NEPTUNE II (INSTRUMENTS) ×2 IMPLANT
NDL HYPO 18GX1.5 BLUNT FILL (NEEDLE) ×1 IMPLANT
NDL INSUFFLATION 14GA 120MM (NEEDLE) ×1 IMPLANT
NEEDLE HYPO 18GX1.5 BLUNT FILL (NEEDLE) ×2 IMPLANT
NEEDLE HYPO 22GX1.5 SAFETY (NEEDLE) ×2 IMPLANT
NEEDLE INSUFFLATION 14GA 120MM (NEEDLE) ×2 IMPLANT
NS IRRIG 1000ML POUR BTL (IV SOLUTION) ×2 IMPLANT
PACK LAP CHOLE LZT030E (CUSTOM PROCEDURE TRAY) ×2 IMPLANT
PAD ARMBOARD 7.5X6 YLW CONV (MISCELLANEOUS) ×2 IMPLANT
SET BASIN LINEN APH (SET/KITS/TRAYS/PACK) ×2 IMPLANT
SET TUBE IRRIG SUCTION NO TIP (IRRIGATION / IRRIGATOR) IMPLANT
SLEEVE ENDOPATH XCEL 5M (ENDOMECHANICALS) ×2 IMPLANT
SPONGE GAUZE 2X2 8PLY STRL LF (GAUZE/BANDAGES/DRESSINGS) ×8 IMPLANT
STAPLER VISISTAT (STAPLE) ×2 IMPLANT
SUT VICRYL 0 UR6 27IN ABS (SUTURE) ×2 IMPLANT
SYR 20CC LL (SYRINGE) ×2 IMPLANT
SYS BAG RETRIEVAL 10MM (BASKET) ×1
SYSTEM BAG RETRIEVAL 10MM (BASKET) ×1 IMPLANT
TAPE CLOTH SURG 4X10 WHT LF (GAUZE/BANDAGES/DRESSINGS) ×1 IMPLANT
TROCAR ENDO BLADELESS 11MM (ENDOMECHANICALS) ×2 IMPLANT
TROCAR XCEL NON-BLD 5MMX100MML (ENDOMECHANICALS) ×2 IMPLANT
TROCAR XCEL UNIV SLVE 11M 100M (ENDOMECHANICALS) ×2 IMPLANT
TUBE CONNECTING 12X1/4 (SUCTIONS) ×2 IMPLANT
TUBING INSUFFLATION (TUBING) ×2 IMPLANT
WARMER LAPAROSCOPE (MISCELLANEOUS) ×2 IMPLANT

## 2018-08-14 NOTE — Anesthesia Preprocedure Evaluation (Signed)
Anesthesia Evaluation  Patient identified by MRN, date of birth, ID band Patient awake    Reviewed: Allergy & Precautions, H&P , NPO status , Patient's Chart, lab work & pertinent test results, reviewed documented beta blocker date and time   Airway Mallampati: II  TM Distance: >3 FB Neck ROM: full   Comment: TMJ  Dental no notable dental hx.    Pulmonary neg pulmonary ROS,    Pulmonary exam normal breath sounds clear to auscultation       Cardiovascular Exercise Tolerance: Good negative cardio ROS   Rhythm:regular Rate:Normal     Neuro/Psych  Headaches, PSYCHIATRIC DISORDERS Anxiety Depression  Neuromuscular disease negative neurological ROS  negative psych ROS   GI/Hepatic negative GI ROS, Neg liver ROS,   Endo/Other  negative endocrine ROS  Renal/GU negative Renal ROS  negative genitourinary   Musculoskeletal   Abdominal   Peds  Hematology negative hematology ROS (+)   Anesthesia Other Findings   Reproductive/Obstetrics negative OB ROS                             Anesthesia Physical Anesthesia Plan  ASA: II  Anesthesia Plan: General   Post-op Pain Management:    Induction:   PONV Risk Score and Plan:   Airway Management Planned:   Additional Equipment:   Intra-op Plan:   Post-operative Plan:   Informed Consent: I have reviewed the patients History and Physical, chart, labs and discussed the procedure including the risks, benefits and alternatives for the proposed anesthesia with the patient or authorized representative who has indicated his/her understanding and acceptance.   Dental Advisory Given  Plan Discussed with: CRNA  Anesthesia Plan Comments:         Anesthesia Quick Evaluation

## 2018-08-14 NOTE — Transfer of Care (Signed)
Immediate Anesthesia Transfer of Care Note  Patient: Kayla White  Procedure(s) Performed: LAPAROSCOPIC CHOLECYSTECTOMY (N/A )  Patient Location: PACU  Anesthesia Type:General  Level of Consciousness: awake and patient cooperative  Airway & Oxygen Therapy: Patient Spontanous Breathing and Patient connected to nasal cannula oxygen  Post-op Assessment: Report given to RN and Post -op Vital signs reviewed and stable  Post vital signs: Reviewed and stable  Last Vitals:  Vitals Value Taken Time  BP    Temp    Pulse    Resp    SpO2      Last Pain:  Vitals:   08/14/18 0900  PainSc: 2          Complications: No apparent anesthesia complications

## 2018-08-14 NOTE — Interval H&P Note (Signed)
History and Physical Interval Note:  08/14/2018 9:39 AM  [name]  has presented today for surgery, with the diagnosis of bilary sludge  The various methods of treatment have been discussed with the patient and family. After consideration of risks, benefits and other options for treatment, the patient has consented to  Procedure(s): LAPAROSCOPIC CHOLECYSTECTOMY (N/A) as a surgical intervention .  The patient's history has been reviewed, patient examined, no change in status, stable for surgery.  I have reviewed the patient's chart and labs.  Questions were answered to the patient's satisfaction.     Franky MachoMark Dimitriy Carreras

## 2018-08-14 NOTE — Anesthesia Postprocedure Evaluation (Signed)
Anesthesia Post Note  Patient: Kayla White  Procedure(s) Performed: LAPAROSCOPIC CHOLECYSTECTOMY (N/A )  Patient location during evaluation: PACU Anesthesia Type: General Level of consciousness: awake and alert Pain management: satisfactory to patient Vital Signs Assessment: post-procedure vital signs reviewed and stable Respiratory status: spontaneous breathing Cardiovascular status: stable Postop Assessment: no apparent nausea or vomiting Anesthetic complications: no     Last Vitals:  Vitals:   08/14/18 1145 08/14/18 1200  BP: 116/81 120/80  Pulse: 78 79  Resp: 14 16  Temp:  37 C  SpO2: 99% 97%    Last Pain:  Vitals:   08/14/18 1200  TempSrc: Oral  PainSc: 4                  Conal Shetley

## 2018-08-14 NOTE — Op Note (Signed)
Patient:  Kayla BearsGretchen [name]  DOB:  01/10/1971  MRN:  [id number]   Preop Diagnosis: Biliary sludge  Postop Diagnosis: Same  Procedure: Laparoscopic cholecystectomy  Surgeon: Franky MachoMark Lanorris Kalisz, MD  Anes: General endotracheal  Indications: Patient is a 47 year old white female who presents with biliary colic secondary to biliary sludge.  The risks and benefits of the procedure including bleeding, infection, hepatobiliary injury, and the possibility of an open procedure were fully explained to the patient, who gave informed consent.  Procedure note: The patient was placed in supine position.  After induction of general endotracheal anesthesia, the abdomen was prepped and draped using the usual sterile technique with DuraPrep.  Surgical site confirmation was performed.  A supraumbilical incision was made down to the fascia.  A Veress needle was introduced into the abdominal cavity and confirmation of placement was done using the saline drop test.  The abdomen was then insufflated to 16 mmHg pressure.  An 11 mm trocar was introduced into the abdominal cavity under direct visualization without difficulty.  We initially had to delay pneumoperitoneum due to bradycardia which did resolve.  An 11 mm trocar was then placed in the epigastric region and 5 mm trochars were placed the right upper quadrant and right flank regions.  Liver was inspected and noted to be within normal limits.  The gallbladder was retracted in a dynamic fashion in order to provide a critical view of the triangle of Calot.  The cystic duct was first identified.  Its juncture to the infundibulum was fully identified.  It was noted to be very elongated and narrow.  Endoclips were placed proximally and distally on the cystic duct, and the cystic duct was divided.  This was likewise done the cystic artery.  The gallbladder was freed away from the gallbladder fossa using Bovie electrocautery.  The gallbladder was delivered through the  epigastric trocar site using an Endo Catch bag.  The gallbladder fossa was inspected and no abnormal bleeding or bile leakage was noted.  Surgicel was placed in the gallbladder fossa.  All fluid and air were then evacuated from the abdominal cavity prior to the removal of the trochars.  All wounds were irrigated with normal saline.  All wounds were injected with 0.5% Sensorcaine.  The supraumbilical fascia was reapproximated using 0 Vicryl interrupted suture.  All skin incisions were closed using staples.  Betadine ointment and dry sterile dressings were applied.  All tape and needle counts were correct at the end of the procedure.  The patient was extubated in the operating room and transferred to PACU in stable condition.  Complications: None  EBL: Minimal  Specimen: Gallbladder

## 2018-08-14 NOTE — Discharge Instructions (Signed)
Laparoscopic Cholecystectomy, Care After °This sheet gives you information about how to care for yourself after your procedure. Your health care provider may also give you more specific instructions. If you have problems or questions, contact your health care provider. °What can I expect after the procedure? °After the procedure, it is common to have: °· Pain at your incision sites. You will be given medicines to control this pain. °· Mild nausea or vomiting. °· Bloating and possible shoulder pain from the air-like gas that was used during the procedure. ° °Follow these instructions at home: °Incision care ° °· Follow instructions from your health care provider about how to take care of your incisions. Make sure you: °? Wash your hands with soap and water before you change your bandage (dressing). If soap and water are not available, use hand sanitizer. °? Change your dressing as told by your health care provider. °? Leave stitches (sutures), skin glue, or adhesive strips in place. These skin closures may need to be in place for 2 weeks or longer. If adhesive strip edges start to loosen and curl up, you may trim the loose edges. Do not remove adhesive strips completely unless your health care provider tells you to do that. °· Do not take baths, swim, or use a hot tub until your health care provider approves. Ask your health care provider if you can take showers. You may only be allowed to take sponge baths for bathing. °· Check your incision area every day for signs of infection. Check for: °? More redness, swelling, or pain. °? More fluid or blood. °? Warmth. °? Pus or a bad smell. °Activity °· Do not drive or use heavy machinery while taking prescription pain medicine. °· Do not lift anything that is heavier than 10 lb (4.5 kg) until your health care provider approves. °· Do not play contact sports until your health care provider approves. °· Do not drive for 24 hours if you were given a medicine to help you relax  (sedative). °· Rest as needed. Do not return to work or school until your health care provider approves. °General instructions °· Take over-the-counter and prescription medicines only as told by your health care provider. °· To prevent or treat constipation while you are taking prescription pain medicine, your health care provider may recommend that you: °? Drink enough fluid to keep your urine clear or pale yellow. °? Take over-the-counter or prescription medicines. °? Eat foods that are high in fiber, such as fresh fruits and vegetables, whole grains, and beans. °? Limit foods that are high in fat and processed sugars, such as fried and sweet foods. °Contact a health care provider if: °· You develop a rash. °· You have more redness, swelling, or pain around your incisions. °· You have more fluid or blood coming from your incisions. °· Your incisions feel warm to the touch. °· You have pus or a bad smell coming from your incisions. °· You have a fever. °· One or more of your incisions breaks open. °Get help right away if: °· You have trouble breathing. °· You have chest pain. °· You have increasing pain in your shoulders. °· You faint or feel dizzy when you stand. °· You have severe pain in your abdomen. °· You have nausea or vomiting that lasts for more than one day. °· You have leg pain. °This information is not intended to replace advice given to you by your health care provider. Make sure you discuss any questions you   have with your health care provider. °Document Released: 11/11/2005 Document Revised: 06/01/2016 Document Reviewed: 04/29/2016 °Elsevier Interactive Patient Education © 2018 [employer] ° ° °General Anesthesia, Adult, Care After °These instructions provide you with information about caring for yourself after your procedure. Your health care provider may also give you more specific instructions. Your treatment has been planned according to current medical practices, but problems sometimes occur.  Call your health care provider if you have any problems or questions after your procedure. °What can I expect after the procedure? °After the procedure, it is common to have: °· Vomiting. °· A sore throat. °· Mental slowness. ° °It is common to feel: °· Nauseous. °· Cold or shivery. °· Sleepy. °· Tired. °· Sore or achy, even in parts of your body where you did not have surgery. ° °Follow these instructions at home: °For at least 24 hours after the procedure: °· Do not: °? Participate in activities where you could fall or become injured. °? Drive. °? Use heavy machinery. °? Drink alcohol. °? Take sleeping pills or medicines that cause drowsiness. °? Make important decisions or sign legal documents. °? Take care of children on your own. °· Rest. °Eating and drinking °· If you vomit, drink water, juice, or soup when you can drink without vomiting. °· Drink enough fluid to keep your urine clear or pale yellow. °· Make sure you have little or no nausea before eating solid foods. °· Follow the diet recommended by your health care provider. °General instructions °· Have a responsible adult stay with you until you are awake and alert. °· Return to your normal activities as told by your health care provider. Ask your health care provider what activities are safe for you. °· Take over-the-counter and prescription medicines only as told by your health care provider. °· If you smoke, do not smoke without supervision. °· Keep all follow-up visits as told by your health care provider. This is important. °Contact a health care provider if: °· You continue to have nausea or vomiting at home, and medicines are not helpful. °· You cannot drink fluids or start eating again. °· You cannot urinate after 8-12 hours. °· You develop a skin rash. °· You have fever. °· You have increasing redness at the site of your procedure. °Get help right away if: °· You have difficulty breathing. °· You have chest pain. °· You have unexpected  bleeding. °· You feel that you are having a life-threatening or urgent problem. °This information is not intended to replace advice given to you by your health care provider. Make sure you discuss any questions you have with your health care provider. °Document Released: 02/17/2001 Document Revised: 04/15/2016 Document Reviewed: 10/26/2015 °Elsevier Interactive Patient Education © 2018 [employer] ° °

## 2018-08-14 NOTE — Anesthesia Procedure Notes (Signed)
Procedure Name: Intubation Date/Time: 08/14/2018 10:15 AM Performed by: Vista Deck, CRNA Pre-anesthesia Checklist: Patient identified, Patient being monitored, Timeout performed, Emergency Drugs available and Suction available Patient Re-evaluated:Patient Re-evaluated prior to induction Oxygen Delivery Method: Circle System Utilized Preoxygenation: Pre-oxygenation with 100% oxygen Induction Type: IV induction Ventilation: Mask ventilation without difficulty Laryngoscope Size: Mac and 3 Grade View: Grade I Tube type: Oral Tube size: 7.0 mm Number of attempts: 1 Airway Equipment and Method: stylet and Oral airway Placement Confirmation: ETT inserted through vocal cords under direct vision,  positive ETCO2 and breath sounds checked- equal and bilateral Secured at: 21 cm Tube secured with: Tape Dental Injury: Teeth and Oropharynx as per pre-operative assessment

## 2018-08-17 ENCOUNTER — Encounter (HOSPITAL_COMMUNITY): Payer: Self-pay | Admitting: General Surgery

## 2018-08-17 NOTE — Progress Notes (Signed)
During post op follow up call patient complained of rash that started around waste on Sunday and moving up toward neck today. Red/purple in color and itching. Patient taking benadryl. Discussed with Dr. Lovell SheehanJenkins. Patient advised to see medical dr for rash and if effects breathing go straight to the Emergency room.

## 2018-08-18 ENCOUNTER — Ambulatory Visit (INDEPENDENT_AMBULATORY_CARE_PROVIDER_SITE_OTHER): Payer: Self-pay | Admitting: General Surgery

## 2018-08-18 ENCOUNTER — Encounter: Payer: Self-pay | Admitting: General Surgery

## 2018-08-18 VITALS — BP 118/57 | HR 75 | Temp 98.9°F | Resp 20 | Wt 168.0 lb

## 2018-08-18 DIAGNOSIS — Z09 Encounter for follow-up examination after completed treatment for conditions other than malignant neoplasm: Secondary | ICD-10-CM

## 2018-08-18 NOTE — Progress Notes (Signed)
Subjective:     Kayla White  Status post laparoscopic cholecystectomy.  Patient was doing well until few days after surgery when she developed a truncal rash.  She has taken Benadryl and stopped her pain medication, but the rash has not resolved.  It does cause her to itch.  It is on her trunk, arms, and back.  Her legs were spared.  Her face was spared.  She denies any fever or chills. Objective:    BP (!) 118/57 (BP Location: Left Arm, Patient Position: Sitting, Cuff Size: Normal)   Pulse 75   Temp 98.9 F (37.2 C) (Temporal)   Resp 20   Wt 168 lb (76.2 kg)   LMP 07/21/2018 (Exact Date)   BMI 24.81 kg/m   General:  alert, cooperative and no distress  Abdomen is soft, incisions healing well.  Staples removed, Steri-Strips applied. Skin examination reveals a rash present on the trunk, arms, and back. Final pathology consistent with diagnosis.     Assessment:    Postoperative pruritic rash of unknown etiology.  Otherwise doing well from the surgical standpoint.    Plan:   Will give 6-day course of 10 mg steroid Dosepak.  Patient instructed to continue Benadryl.  This should resolve with time.  I will see her in 1 week for follow-up as needed.

## 2018-08-20 ENCOUNTER — Ambulatory Visit: Payer: Self-pay | Admitting: General Surgery

## 2018-08-25 ENCOUNTER — Ambulatory Visit: Payer: Self-pay | Admitting: General Surgery

## 2018-10-09 ENCOUNTER — Encounter: Payer: Self-pay | Admitting: Gastroenterology

## 2018-10-13 ENCOUNTER — Encounter: Payer: Self-pay | Admitting: Gastroenterology

## 2018-10-13 ENCOUNTER — Ambulatory Visit (INDEPENDENT_AMBULATORY_CARE_PROVIDER_SITE_OTHER): Payer: 59 | Admitting: Gastroenterology

## 2018-10-13 DIAGNOSIS — K219 Gastro-esophageal reflux disease without esophagitis: Secondary | ICD-10-CM

## 2018-10-13 DIAGNOSIS — R1011 Right upper quadrant pain: Secondary | ICD-10-CM | POA: Diagnosis not present

## 2018-10-13 DIAGNOSIS — R1310 Dysphagia, unspecified: Secondary | ICD-10-CM | POA: Diagnosis not present

## 2018-10-13 DIAGNOSIS — R1319 Other dysphagia: Secondary | ICD-10-CM | POA: Insufficient documentation

## 2018-10-13 MED ORDER — SUCRALFATE 1 GM/10ML PO SUSP: 1.0000 g | Freq: Three times a day (TID) | ORAL | 0 refills | Status: DC

## 2018-10-13 NOTE — Progress Notes (Signed)
Primary Care Physician: Richardean Chimera, MD  Primary Gastroenterologist:  Roetta Sessions, MD   Chief Complaint  Patient presents with  . Gastroesophageal Reflux    anything she drinks other than water causes severe reflux. She does not feel the omeprazole is working  . Abdominal Pain    abd pain is getting worse even after having gallbladder removed.     HPI: Kayla White is a 47 y.o. female here for follow-up.  Seen as a new patient referral on 07/09/2018 for abdominal pain and dysphagia.  No prior colonoscopy or upper endoscopy at that time.  Blood work positive for H. pylori, status post treatment but she did not take PPI (took ranitidine instead) with this as she was supposed to.  Was given additional antibiotics from the ED for H. pylori again but patient states her PCP told her not to take them so she didn't.  Was tested for H. pylori via stool specimen and patient reports was negative, but she's not sure if she was off antibiotics long enough before testing.  HIDA scan with EF of 97%.  August 2019 gallbladder showed sludge but no stones.  BPE showed tiny hiatal hernia otherwise normal.  She was scheduled for EGD/TCS here 07/29/18 but in interim she saw Dr. Gabriel Cirri and had both of these procedures done August 27. EGD reported as normal, no biopsies, NO esophageal dilation.  Colonoscopy normal but she had grade 1 hemorrhoids.  She had her gallbladder removed by Dr. Lovell Sheehan on September 20.  She continues to feel poorly.  She states she was fine until she traveled to [place] in May.  All the symptoms started since then.  Symptoms started out as pain in the substernal region, radiating into her back.  Doubled over with severity of pain.  She had isolated episodes a couple times a month through August.  She notes that she developed right upper quadrant pain at some point and it was suspected that her symptoms would improve after getting her gallbladder out but she has not noted  any improvement.  In fact her symptoms may be worse.  Her right upper quadrant discomfort is stable.  Her biggest concern is severe burning type pain in the center of her chest which radiates into her back.  She had a couple of really bad episodes lately.  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  She developed pain associated with diaphoresis.  EKG performed on site, patient states that she was encouraged to go to the ED but she refused.  She states she saw her PCP the following day, EKG was normal.  Negative cardiac labs.  None of her symptoms are exertional.  Sometimes when she gets this burning sensation, leaning over will help.  Anything she eats or drinks causes her to be on fire.  She has had increased belching over the past week.  Feels like her stomach is distended.  Stools at baseline alternate between constipation and diarrhea.  May go couple days without a bowel movement.  Will use a glycerin suppository when needed.  Denies any significant NSAID or aspirin use.  Takes Excedrin on occasion for headache but will go weeks without taking any.  Patient continues to complain of difficulty swallowing, pills and solid foods are the worst.  Feels like things stick going down.  Denies any ongoing rectal bleeding.  Current Outpatient Medications  Medication Sig Dispense Refill  . acetaminophen (TYLENOL) 500 MG tablet Take  500-1,000 mg by mouth every 6 (six) hours as needed (for pain.).     Marland Kitchen. Aspirin-Acetaminophen-Caffeine (EXCEDRIN MIGRAINE PO) Take 1-2 tablets by mouth 2 (two) times daily as needed (for headaches.).     Marland Kitchen. diphenhydrAMINE (BENADRYL) 25 MG tablet Take 25 mg by mouth every 6 (six) hours as needed.    . halobetasol (ULTRAVATE) 0.05 % cream Apply 1 application topically 2 (two) times daily as needed (for ezcema).    Marland Kitchen. HYDROcodone-acetaminophen (NORCO) 5-325 MG tablet Take 1 tablet by mouth every 4 (four) hours as needed for moderate pain. 30 tablet 0  . Multiple Vitamin  (MULTIVITAMIN WITH MINERALS) TABS tablet Take 1 tablet by mouth daily.    Marland Kitchen. omeprazole (PRILOSEC) 20 MG capsule Take 20 mg by mouth daily as needed (for acid reflux/indigestion.).     Marland Kitchen. Probiotic Product (PROBIOTIC PO) Take 1 capsule by mouth daily.     . sertraline (ZOLOFT) 100 MG tablet Take 100 mg by mouth at bedtime.      No current facility-administered medications for this visit.     Allergies as of 10/13/2018 - Review Complete 10/13/2018  Allergen Reaction Noted  . Bee venom Anaphylaxis and Swelling 08/10/2018  . Penicillins Rash 06/04/2016   Past Medical History:  Diagnosis Date  . Anxiety   . Depression   . IBS (irritable bowel syndrome)   . Migraines    Past Surgical History:  Procedure Laterality Date  . CHOLECYSTECTOMY N/A 08/14/2018   Procedure: LAPAROSCOPIC CHOLECYSTECTOMY;  Surgeon: Franky MachoJenkins, Mark, MD;  Location: [facility];  Service: General;  Laterality: N/A;  . COLONOSCOPY WITH ESOPHAGOGASTRODUODENOSCOPY (EGD)  06/2018   Dr. Gabriel CirrieMason: EGD "normal".  Colonoscopy with hemorrhoids.  Marland Kitchen. DILATION AND CURETTAGE OF UTERUS  2009  . IVF  2007, 099 amd 2010  . LAPAROSCOPIC ABDOMINAL EXPLORATION     R/o endometriosis   Past Medical History:  Diagnosis Date  . Anxiety   . Depression   . IBS (irritable bowel syndrome)   . Migraines    Social History   Tobacco Use  . Smoking status: Never Smoker  . Smokeless tobacco: Never Used  Substance Use Topics  . Alcohol use: No    Alcohol/week: 0.0 standard drinks  . Drug use: No    ROS:  General: Negative for anorexia, weight loss, fever, chills, fatigue, weakness. ENT: Negative for hoarseness,  nasal congestion. See hpi. CV: Negative for chest pain, angina, palpitations, dyspnea on exertion, peripheral edema.  Respiratory: Negative for dyspnea at rest, dyspnea on exertion, cough, sputum, wheezing.  GI: See history of present illness. GU:  Negative for dysuria, hematuria, urinary incontinence, urinary frequency, nocturnal  urination.  Endo: Negative for unusual weight change.    Physical Examination:   BP 128/71   Pulse 86   Temp (!) 97 F (36.1 C) (Oral)   Ht 5\' 9"  (1.753 m)   Wt 169 lb 3.2 oz (76.7 kg)   LMP 09/22/2018   BMI 24.99 kg/m   General: Well-nourished, well-developed in no acute distress.  Eyes: No icterus. Mouth: Oropharyngeal mucosa moist and pink , no lesions erythema or exudate. Lungs: Clear to auscultation bilaterally.  Heart: Regular rate and rhythm, no murmurs rubs or gallops.  Abdomen: Bowel sounds are normal, mild ruq tenderness/epig tenderness, nondistended, no hepatosplenomegaly or masses, no abdominal bruits or hernia , no rebound or guarding.   Extremities: No lower extremity edema. No clubbing or deformities. Neuro: Alert and oriented x 4   Skin: Warm and dry, no jaundice.  Psych: Alert and cooperative, normal mood and affect.  Labs:  Labs from June 18, 2018 White blood cell count 5400, hemoglobin 12.8, hematocrit 38.9, platelets 158,000, glucose 96, BUN 14, creatinine 0.98, total bilirubin 0.3, alkaline phosphatase 51, AST 14, ALT 14, albumin 4.3.  Imaging Studies: No results found.

## 2018-10-13 NOTE — H&P (View-Only) (Signed)
    Primary Care Physician: Daniel, Terry G, MD  Primary Gastroenterologist:  Michael Rourk, MD   Chief Complaint  Patient presents with  . Gastroesophageal Reflux    anything she drinks other than water causes severe reflux. She does not feel the omeprazole is working  . Abdominal Pain    abd pain is getting worse even after having gallbladder removed.     HPI: Kayla White is a 47 y.o. female here for follow-up.  Seen as a new patient referral on 07/09/2018 for abdominal pain and dysphagia.  No prior colonoscopy or upper endoscopy at that time.  Blood work positive for H. pylori, status post treatment but she did not take PPI (took ranitidine instead) with this as she was supposed to.  Was given additional antibiotics from the ED for H. pylori again but patient states her PCP told her not to take them so she didn't.  Was tested for H. pylori via stool specimen and patient reports was negative, but she's not sure if she was off antibiotics long enough before testing.  HIDA scan with EF of 97%.  August 2019 gallbladder showed sludge but no stones.  BPE showed tiny hiatal hernia otherwise normal.  She was scheduled for EGD/TCS here 07/29/18 but in interim she saw Dr. DeMason and had both of these procedures done August 27. EGD reported as normal, no biopsies, NO esophageal dilation.  Colonoscopy normal but she had grade 1 hemorrhoids.  She had her gallbladder removed by Dr. Jenkins on September 20.  She continues to feel poorly.  She states she was fine until she traveled to [place] in May.  All the symptoms started since then.  Symptoms started out as pain in the substernal region, radiating into her back.  Doubled over with severity of pain.  She had isolated episodes a couple times a month through August.  She notes that she developed right upper quadrant pain at some point and it was suspected that her symptoms would improve after getting her gallbladder out but she has not noted  any improvement.  In fact her symptoms may be worse.  Her right upper quadrant discomfort is stable.  Her biggest concern is severe burning type pain in the center of her chest which radiates into her back.  She had a couple of really bad episodes lately.  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  She developed pain associated with diaphoresis.  EKG performed on site, patient states that she was encouraged to go to the ED but she refused.  She states she saw her PCP the following day, EKG was normal.  Negative cardiac labs.  None of her symptoms are exertional.  Sometimes when she gets this burning sensation, leaning over will help.  Anything she eats or drinks causes her to be on fire.  She has had increased belching over the past week.  Feels like her stomach is distended.  Stools at baseline alternate between constipation and diarrhea.  May go couple days without a bowel movement.  Will use a glycerin suppository when needed.  Denies any significant NSAID or aspirin use.  Takes Excedrin on occasion for headache but will go weeks without taking any.  Patient continues to complain of difficulty swallowing, pills and solid foods are the worst.  Feels like things stick going down.  Denies any ongoing rectal bleeding.  Current Outpatient Medications  Medication Sig Dispense Refill  . acetaminophen (TYLENOL) 500 MG tablet Take   500-1,000 mg by mouth every 6 (six) hours as needed (for pain.).     . Aspirin-Acetaminophen-Caffeine (EXCEDRIN MIGRAINE PO) Take 1-2 tablets by mouth 2 (two) times daily as needed (for headaches.).     . diphenhydrAMINE (BENADRYL) 25 MG tablet Take 25 mg by mouth every 6 (six) hours as needed.    . halobetasol (ULTRAVATE) 0.05 % cream Apply 1 application topically 2 (two) times daily as needed (for ezcema).    . HYDROcodone-acetaminophen (NORCO) 5-325 MG tablet Take 1 tablet by mouth every 4 (four) hours as needed for moderate pain. 30 tablet 0  . Multiple Vitamin  (MULTIVITAMIN WITH MINERALS) TABS tablet Take 1 tablet by mouth daily.    . omeprazole (PRILOSEC) 20 MG capsule Take 20 mg by mouth daily as needed (for acid reflux/indigestion.).     . Probiotic Product (PROBIOTIC PO) Take 1 capsule by mouth daily.     . sertraline (ZOLOFT) 100 MG tablet Take 100 mg by mouth at bedtime.      No current facility-administered medications for this visit.     Allergies as of 10/13/2018 - Review Complete 10/13/2018  Allergen Reaction Noted  . Bee venom Anaphylaxis and Swelling 08/10/2018  . Penicillins Rash 06/04/2016   Past Medical History:  Diagnosis Date  . Anxiety   . Depression   . IBS (irritable bowel syndrome)   . Migraines    Past Surgical History:  Procedure Laterality Date  . CHOLECYSTECTOMY N/A 08/14/2018   Procedure: LAPAROSCOPIC CHOLECYSTECTOMY;  Surgeon: Jenkins, Mark, MD;  Location: [facility];  Service: General;  Laterality: N/A;  . COLONOSCOPY WITH ESOPHAGOGASTRODUODENOSCOPY (EGD)  06/2018   Dr. DeMason: EGD "normal".  Colonoscopy with hemorrhoids.  . DILATION AND CURETTAGE OF UTERUS  2009  . IVF  2007, 099 amd 2010  . LAPAROSCOPIC ABDOMINAL EXPLORATION     R/o endometriosis   Past Medical History:  Diagnosis Date  . Anxiety   . Depression   . IBS (irritable bowel syndrome)   . Migraines    Social History   Tobacco Use  . Smoking status: Never Smoker  . Smokeless tobacco: Never Used  Substance Use Topics  . Alcohol use: No    Alcohol/week: 0.0 standard drinks  . Drug use: No    ROS:  General: Negative for anorexia, weight loss, fever, chills, fatigue, weakness. ENT: Negative for hoarseness,  nasal congestion. See hpi. CV: Negative for chest pain, angina, palpitations, dyspnea on exertion, peripheral edema.  Respiratory: Negative for dyspnea at rest, dyspnea on exertion, cough, sputum, wheezing.  GI: See history of present illness. GU:  Negative for dysuria, hematuria, urinary incontinence, urinary frequency, nocturnal  urination.  Endo: Negative for unusual weight change.    Physical Examination:   BP 128/71   Pulse 86   Temp (!) 97 F (36.1 C) (Oral)   Ht 5' 9" (1.753 m)   Wt 169 lb 3.2 oz (76.7 kg)   LMP 09/22/2018   BMI 24.99 kg/m   General: Well-nourished, well-developed in no acute distress.  Eyes: No icterus. Mouth: Oropharyngeal mucosa moist and pink , no lesions erythema or exudate. Lungs: Clear to auscultation bilaterally.  Heart: Regular rate and rhythm, no murmurs rubs or gallops.  Abdomen: Bowel sounds are normal, mild ruq tenderness/epig tenderness, nondistended, no hepatosplenomegaly or masses, no abdominal bruits or hernia , no rebound or guarding.   Extremities: No lower extremity edema. No clubbing or deformities. Neuro: Alert and oriented x 4   Skin: Warm and dry, no jaundice.     Psych: Alert and cooperative, normal mood and affect.  Labs:  Labs from June 18, 2018 White blood cell count 5400, hemoglobin 12.8, hematocrit 38.9, platelets 158,000, glucose 96, BUN 14, creatinine 0.98, total bilirubin 0.3, alkaline phosphatase 51, AST 14, ALT 14, albumin 4.3.  Imaging Studies: No results found.     

## 2018-10-13 NOTE — Patient Instructions (Signed)
1. Trial of carafate four times a day before meals and at bedtime.  2. Hold off on omeprazole, TUMS, PEPTO until we decide next step regarding H.pylori.  3. We will be in touch in next 24 hours regarding next step ie if we need to repeat upper endoscopy.

## 2018-10-14 ENCOUNTER — Encounter: Payer: Self-pay | Admitting: *Deleted

## 2018-10-14 ENCOUNTER — Encounter: Payer: Self-pay | Admitting: Gastroenterology

## 2018-10-14 ENCOUNTER — Telehealth: Payer: Self-pay | Admitting: Internal Medicine

## 2018-10-14 DIAGNOSIS — R1011 Right upper quadrant pain: Secondary | ICD-10-CM

## 2018-10-14 DIAGNOSIS — R1319 Other dysphagia: Secondary | ICD-10-CM

## 2018-10-14 DIAGNOSIS — R1310 Dysphagia, unspecified: Secondary | ICD-10-CM

## 2018-10-14 DIAGNOSIS — K219 Gastro-esophageal reflux disease without esophagitis: Secondary | ICD-10-CM

## 2018-10-14 NOTE — Assessment & Plan Note (Addendum)
Very pleasant 47 year old female with 3336-month history of burning discomfort in the center of her chest, radiates into the back, epigastric/right upper quadrant pain.  She has a tiny hiatal hernia based on barium pill esophagram.  EGD and colonoscopy by Dr. Gabriel CirrieMason reported to be unremarkable.  Patient with history of H. pylori, antibiotics and H2 blocker therapy per patient.  Stool antigen negative but unclear if she was off antibiotics long enough per patient.  Recent cholecystectomy with no significant improvement in her symptoms.  She continues to have significant burning from the epigastrium into the throat area.  Significant radiation into the back, possible esophageal spasm.  Complains of pills/solid food dysphagia which is frequent.  She may have underlying motility disorder, esophagitis, poorly controlled reflux, eosinophilic esophagitis.  Her last EGD was nearly 3 months ago.  She may benefit from empiric esophageal dilation, check for H. pylori eradication.  Plan for deep sedation.  I have discussed the risks, alternatives, benefits with regards to but not limited to the risk of reaction to medication, bleeding, infection, perforation and the patient is agreeable to proceed. Written consent to be obtained.  She will stop PPI therapy.  Has not shown any benefit to date, has been on omeprazole only.  Trial of Carafate.  Based on EGD findings, next step may be to try different PPI versus pH/impedance versus CT imaging chest/abdomen.

## 2018-10-14 NOTE — Telephone Encounter (Signed)
Dr. Jena Gaussourk plans on doing biopsies at time of EGD to check for H.pylori. I say wait and if there are any concerns then we can grab a breath test after that.

## 2018-10-14 NOTE — Patient Instructions (Signed)
Kayla White  10/14/2018     @PREFPERIOPPHARMACY @   Your procedure is scheduled on  10/19/2018  Report to [facility] at  1:30   P.M.  Call this number if you have problems the morning of surgery:  [phone]   Remember:  Follow the diet and prep instructions given to you by Dr Luvenia Starchourk's office.                        Take these medicines the morning of surgery with A SIP OF WATER Hydrocodone ( if needed), prilosec, zoloft.    Do not wear jewelry, make-up or nail polish.  Do not wear lotions, powders, or perfumes, or deodorant.  Do not shave 48 hours prior to surgery.  Men may shave face and neck.  Do not bring valuables to the hospital.  [facility] is not responsible for any belongings or valuables.  Contacts, dentures or bridgework may not be worn into surgery.  Leave your suitcase in the car.  After surgery it may be brought to your room.  For patients admitted to the hospital, discharge time will be determined by your treatment team.  Patients discharged the day of surgery will not be allowed to drive home.   Name and phone number of your driver:   family Special instructions:  None  Please read over the following fact sheets that you were given. Anesthesia Post-op Instructions and Care and Recovery After Surgery       Esophagogastroduodenoscopy Esophagogastroduodenoscopy (EGD) is a procedure to examine the lining of the esophagus, stomach, and first part of the small intestine (duodenum). This procedure is done to check for problems such as inflammation, bleeding, ulcers, or growths. During this procedure, a long, flexible, lighted tube with a camera attached (endoscope) is inserted down the throat. Tell a health care provider about:  Any allergies you have.  All medicines you are taking, including vitamins, herbs, eye drops, creams, and over-the-counter medicines.  Any problems you or family members have had with anesthetic  medicines.  Any blood disorders you have.  Any surgeries you have had.  Any medical conditions you have.  Whether you are pregnant or may be pregnant. What are the risks? Generally, this is a safe procedure. However, problems may occur, including:  Infection.  Bleeding.  A tear (perforation) in the esophagus, stomach, or duodenum.  Trouble breathing.  Excessive sweating.  Spasms of the larynx.  A slowed heartbeat.  Low blood pressure.  What happens before the procedure?  Follow instructions from your health care provider about eating or drinking restrictions.  Ask your health care provider about: ? Changing or stopping your regular medicines. This is especially important if you are taking diabetes medicines or blood thinners. ? Taking medicines such as aspirin and ibuprofen. These medicines can thin your blood. Do not take these medicines before your procedure if your health care provider instructs you not to.  Plan to have someone take you home after the procedure.  If you wear dentures, be ready to remove them before the procedure. What happens during the procedure?  To reduce your risk of infection, your health care team will wash or sanitize their hands.  An IV tube will be put in a vein in your hand or arm. You will get medicines and fluids through this tube.  You will be given one or more of the following: ? A medicine  to help you relax (sedative). ? A medicine to numb the area (local anesthetic). This medicine may be sprayed into your throat. It will make you feel more comfortable and keep you from gagging or coughing during the procedure. ? A medicine for pain.  A mouth guard may be placed in your mouth to protect your teeth and to keep you from biting on the endoscope.  You will be asked to lie on your left side.  The endoscope will be lowered down your throat into your esophagus, stomach, and duodenum.  Air will be put into the endoscope. This will  help your health care provider see better.  The lining of your esophagus, stomach, and duodenum will be examined.  Your health care provider may: ? Take a tissue sample so it can be looked at in a lab (biopsy). ? Remove growths. ? Remove objects (foreign bodies) that are stuck. ? Treat any bleeding with medicines or other devices that stop tissue from bleeding. ? Widen (dilate) or stretch narrowed areas of your esophagus and stomach.  The endoscope will be taken out. The procedure may vary among health care providers and hospitals. What happens after the procedure?  Your blood pressure, heart rate, breathing rate, and blood oxygen level will be monitored often until the medicines you were given have worn off.  Do not eat or drink anything until the numbing medicine has worn off and your gag reflex has returned. This information is not intended to replace advice given to you by your health care provider. Make sure you discuss any questions you have with your health care provider. Document Released: 03/14/2005 Document Revised: 04/18/2016 Document Reviewed: 10/05/2015 Elsevier Interactive Patient Education  2018 [employer]. Esophagogastroduodenoscopy, Care After Refer to this sheet in the next few weeks. These instructions provide you with information about caring for yourself after your procedure. Your health care provider may also give you more specific instructions. Your treatment has been planned according to current medical practices, but problems sometimes occur. Call your health care provider if you have any problems or questions after your procedure. What can I expect after the procedure? After the procedure, it is common to have:  A sore throat.  Nausea.  Bloating.  Dizziness.  Fatigue.  Follow these instructions at home:  Do not eat or drink anything until the numbing medicine (local anesthetic) has worn off and your gag reflex has returned. You will know that the  local anesthetic has worn off when you can swallow comfortably.  Do not drive for 24 hours if you received a medicine to help you relax (sedative).  If your health care provider took a tissue sample for testing during the procedure, make sure to get your test results. This is your responsibility. Ask your health care provider or the department performing the test when your results will be ready.  Keep all follow-up visits as told by your health care provider. This is important. Contact a health care provider if:  You cannot stop coughing.  You are not urinating.  You are urinating less than usual. Get help right away if:  You have trouble swallowing.  You cannot eat or drink.  You have throat or chest pain that gets worse.  You are dizzy or light-headed.  You faint.  You have nausea or vomiting.  You have chills.  You have a fever.  You have severe abdominal pain.  You have black, tarry, or bloody stools. This information is not intended to replace advice  given to you by your health care provider. Make sure you discuss any questions you have with your health care provider. Document Released: 10/28/2012 Document Revised: 04/18/2016 Document Reviewed: 10/05/2015 Elsevier Interactive Patient Education  2018 [employer].  Esophageal Dilatation Esophageal dilatation is a procedure to open a blocked or narrowed part of the esophagus. The esophagus is the long tube in your throat that carries food and liquid from your mouth to your stomach. The procedure is also called esophageal dilation. You may need this procedure if you have a buildup of scar tissue in your esophagus that makes it difficult, painful, or even impossible to swallow. This can be caused by gastroesophageal reflux disease (GERD). In rare cases, people need this procedure because they have cancer of the esophagus or a problem with the way food moves through the esophagus. Sometimes you may need to have another  dilatation to enlarge the opening of the esophagus gradually. Tell a health care provider about:  Any allergies you have.  All medicines you are taking, including vitamins, herbs, eye drops, creams, and over-the-counter medicines.  Any problems you or family members have had with anesthetic medicines.  Any blood disorders you have.  Any surgeries you have had.  Any medical conditions you have.  Any antibiotic medicines you are required to take before dental procedures. What are the risks? Generally, this is a safe procedure. However, problems can occur and include:  Bleeding from a tear in the lining of the esophagus.  A hole (perforation) in the esophagus.  What happens before the procedure?  Do not eat or drink anything after midnight on the night before the procedure or as directed by your health care provider.  Ask your health care provider about changing or stopping your regular medicines. This is especially important if you are taking diabetes medicines or blood thinners.  Plan to have someone take you home after the procedure. What happens during the procedure?  You will be given a medicine that makes you relaxed and sleepy (sedative).  A medicine may be sprayed or gargled to numb the back of the throat.  Your health care provider can use various instruments to do an esophageal dilatation. During the procedure, the instrument used will be placed in your mouth and passed down into your esophagus. Options include: ? Simple dilators. This instrument is carefully placed in the esophagus to stretch it. ? Guided wire bougies. In this method, a flexible tube (endoscope) is used to insert a wire into the esophagus. The dilator is passed over this wire to enlarge the esophagus. Then the wire is removed. ? Balloon dilators. An endoscope with a small balloon at the end is passed down into the esophagus. Inflating the balloon gently stretches the esophagus and opens it up. What  happens after the procedure?  Your blood pressure, heart rate, breathing rate, and blood oxygen level will be monitored often until the medicines you were given have worn off.  Your throat may feel slightly sore and will probably still feel numb. This will improve slowly over time.  You will not be allowed to eat or drink until the throat numbness has resolved.  If this is a same-day procedure, you may be allowed to go home once you have been able to drink, urinate, and sit on the edge of the bed without nausea or dizziness.  If this is a same-day procedure, you should have a friend or family member with you for the next 24 hours after the procedure.  This information is not intended to replace advice given to you by your health care provider. Make sure you discuss any questions you have with your health care provider. Document Released: 01/02/2006 Document Revised: 04/18/2016 Document Reviewed: 03/23/2014 Elsevier Interactive Patient Education  2018 [place] Anesthesia is a term that refers to techniques, procedures, and medicines that help a person stay safe and comfortable during a medical procedure. Monitored anesthesia care, or sedation, is one type of anesthesia. Your anesthesia specialist may recommend sedation if you will be having a procedure that does not require you to be unconscious, such as:  Cataract surgery.  A dental procedure.  A biopsy.  A colonoscopy.  During the procedure, you may receive a medicine to help you relax (sedative). There are three levels of sedation:  Mild sedation. At this level, you may feel awake and relaxed. You will be able to follow directions.  Moderate sedation. At this level, you will be sleepy. You may not remember the procedure.  Deep sedation. At this level, you will be asleep. You will not remember the procedure.  The more medicine you are given, the deeper your level of sedation will be. Depending on how  you respond to the procedure, the anesthesia specialist may change your level of sedation or the type of anesthesia to fit your needs. An anesthesia specialist will monitor you closely during the procedure. Let your health care provider know about:  Any allergies you have.  All medicines you are taking, including vitamins, herbs, eye drops, creams, and over-the-counter medicines.  Any use of steroids (by mouth or as a cream).  Any problems you or family members have had with sedatives and anesthetic medicines.  Any blood disorders you have.  Any surgeries you have had.  Any medical conditions you have, such as sleep apnea.  Whether you are pregnant or may be pregnant.  Any use of cigarettes, alcohol, or street drugs. What are the risks? Generally, this is a safe procedure. However, problems may occur, including:  Getting too much medicine (oversedation).  Nausea.  Allergic reaction to medicines.  Trouble breathing. If this happens, a breathing tube may be used to help with breathing. It will be removed when you are awake and breathing on your own.  Heart trouble.  Lung trouble.  Before the procedure Staying hydrated Follow instructions from your health care provider about hydration, which may include:  Up to 2 hours before the procedure - you may continue to drink clear liquids, such as water, clear fruit juice, black coffee, and plain tea.  Eating and drinking restrictions Follow instructions from your health care provider about eating and drinking, which may include:  8 hours before the procedure - stop eating heavy meals or foods such as meat, fried foods, or fatty foods.  6 hours before the procedure - stop eating light meals or foods, such as toast or cereal.  6 hours before the procedure - stop drinking milk or drinks that contain milk.  2 hours before the procedure - stop drinking clear liquids.  Medicines Ask your health care provider about:  Changing or  stopping your regular medicines. This is especially important if you are taking diabetes medicines or blood thinners.  Taking medicines such as aspirin and ibuprofen. These medicines can thin your blood. Do not take these medicines before your procedure if your health care provider instructs you not to.  Tests and exams  You will have a physical exam.  You may  have blood tests done to show: ? How well your kidneys and liver are working. ? How well your blood can clot.  General instructions  Plan to have someone take you home from the hospital or clinic.  If you will be going home right after the procedure, plan to have someone with you for 24 hours.  What happens during the procedure?  Your blood pressure, heart rate, breathing, level of pain and overall condition will be monitored.  An IV tube will be inserted into one of your veins.  Your anesthesia specialist will give you medicines as needed to keep you comfortable during the procedure. This may mean changing the level of sedation.  The procedure will be performed. After the procedure  Your blood pressure, heart rate, breathing rate, and blood oxygen level will be monitored until the medicines you were given have worn off.  Do not drive for 24 hours if you received a sedative.  You may: ? Feel sleepy, clumsy, or nauseous. ? Feel forgetful about what happened after the procedure. ? Have a sore throat if you had a breathing tube during the procedure. ? Vomit. This information is not intended to replace advice given to you by your health care provider. Make sure you discuss any questions you have with your health care provider. Document Released: 08/07/2005 Document Revised: 04/19/2016 Document Reviewed: 03/03/2016 Elsevier Interactive Patient Education  2018 [place], Care After These instructions provide you with information about caring for yourself after your procedure. Your health care  provider may also give you more specific instructions. Your treatment has been planned according to current medical practices, but problems sometimes occur. Call your health care provider if you have any problems or questions after your procedure. What can I expect after the procedure? After your procedure, it is common to:  Feel sleepy for several hours.  Feel clumsy and have poor balance for several hours.  Feel forgetful about what happened after the procedure.  Have poor judgment for several hours.  Feel nauseous or vomit.  Have a sore throat if you had a breathing tube during the procedure.  Follow these instructions at home: For at least 24 hours after the procedure:   Do not: ? Participate in activities in which you could fall or become injured. ? Drive. ? Use heavy machinery. ? Drink alcohol. ? Take sleeping pills or medicines that cause drowsiness. ? Make important decisions or sign legal documents. ? Take care of children on your own.  Rest. Eating and drinking  Follow the diet that is recommended by your health care provider.  If you vomit, drink water, juice, or soup when you can drink without vomiting.  Make sure you have little or no nausea before eating solid foods. General instructions  Have a responsible adult stay with you until you are awake and alert.  Take over-the-counter and prescription medicines only as told by your health care provider.  If you smoke, do not smoke without supervision.  Keep all follow-up visits as told by your health care provider. This is important. Contact a health care provider if:  You keep feeling nauseous or you keep vomiting.  You feel light-headed.  You develop a rash.  You have a fever. Get help right away if:  You have trouble breathing. This information is not intended to replace advice given to you by your health care provider. Make sure you discuss any questions you have with your health care  provider. Document Released:  03/03/2016 Document Revised: 07/03/2016 Document Reviewed: 03/03/2016 Elsevier Interactive Patient Education  [employer].

## 2018-10-14 NOTE — Telephone Encounter (Signed)
Patient called and asked if anything was decided about her care, she was seen yesterday [phone]

## 2018-10-14 NOTE — Telephone Encounter (Signed)
Please let patient know that Dr. Jena Gaussourk wants to do EGD/ED with propofol on her for dysphagia, ruq pain, poorly controlled gerd.   Try to do as fast as possible.   He wants her to hold off on omeprazole until after EGD. She can take carafate.

## 2018-10-14 NOTE — Telephone Encounter (Signed)
I discussed possible CT with Dr. Jena Gaussourk and he agreed with need to obtain EGD first.

## 2018-10-14 NOTE — Telephone Encounter (Signed)
Noted  

## 2018-10-14 NOTE — Telephone Encounter (Addendum)
Spoke with patient and is aware of recs below. She is scheduled for EGD/ED w/MAC on 10/19/18 at 2:30pm. Orders entered. Patient aware I will call her back with pre-op appointment and will discuss instructions in details.   She also wants to know if she can have the CT done mentioned at her OV yesterday. She states something is wrong with her and she wants to find out what it is since these symptoms have been going on x May. Please advise Magda Paganini. Thanks

## 2018-10-14 NOTE — Telephone Encounter (Signed)
Pt aware.

## 2018-10-14 NOTE — Telephone Encounter (Signed)
LMOVM for pt. Pre-op scheduled for 10/16/18 at 11:00am. Also need to discuss instructions for EGD with her and advise of recs below

## 2018-10-14 NOTE — Telephone Encounter (Signed)
Spoke with patient and is aware of pre-op appt. I also discussed her instructions in detail for EGD. She is also aware of recs regarding CT.  She stated on Tuesday she will be off omeprazole x 2 weeks and LSL had mentioned about her having a breath test done to check for h pylori. She is wanting to know if this will be ordered for her. Please advise LSL. thanks

## 2018-10-15 NOTE — Progress Notes (Signed)
CC'D TO PCP °

## 2018-10-16 ENCOUNTER — Other Ambulatory Visit: Payer: Self-pay

## 2018-10-16 ENCOUNTER — Encounter (HOSPITAL_COMMUNITY): Payer: Self-pay

## 2018-10-16 ENCOUNTER — Encounter (HOSPITAL_COMMUNITY)
Admission: RE | Admit: 2018-10-16 | Discharge: 2018-10-16 | Disposition: A | Discharge status: Home / Self Care | Payer: 59 | Source: Ambulatory Visit | Attending: Internal Medicine | Admitting: Internal Medicine

## 2018-10-16 DIAGNOSIS — Z79899 Other long term (current) drug therapy: Secondary | ICD-10-CM | POA: Diagnosis not present

## 2018-10-16 DIAGNOSIS — K2950 Unspecified chronic gastritis without bleeding: Secondary | ICD-10-CM | POA: Diagnosis not present

## 2018-10-16 DIAGNOSIS — F329 Major depressive disorder, single episode, unspecified: Secondary | ICD-10-CM | POA: Diagnosis not present

## 2018-10-16 DIAGNOSIS — K449 Diaphragmatic hernia without obstruction or gangrene: Secondary | ICD-10-CM | POA: Diagnosis not present

## 2018-10-16 DIAGNOSIS — K222 Esophageal obstruction: Secondary | ICD-10-CM | POA: Diagnosis not present

## 2018-10-16 DIAGNOSIS — K580 Irritable bowel syndrome with diarrhea: Secondary | ICD-10-CM | POA: Diagnosis not present

## 2018-10-16 DIAGNOSIS — Z01812 Encounter for preprocedural laboratory examination: Secondary | ICD-10-CM

## 2018-10-16 DIAGNOSIS — Z7982 Long term (current) use of aspirin: Secondary | ICD-10-CM | POA: Diagnosis not present

## 2018-10-16 DIAGNOSIS — F419 Anxiety disorder, unspecified: Secondary | ICD-10-CM | POA: Diagnosis not present

## 2018-10-16 DIAGNOSIS — R1314 Dysphagia, pharyngoesophageal phase: Secondary | ICD-10-CM | POA: Diagnosis present

## 2018-10-16 DIAGNOSIS — K210 Gastro-esophageal reflux disease with esophagitis: Secondary | ICD-10-CM | POA: Diagnosis not present

## 2018-10-16 HISTORY — DX: Gastro-esophageal reflux disease without esophagitis: K21.9

## 2018-10-16 LAB — PREGNANCY, URINE: Preg Test, Ur: NEGATIVE

## 2018-10-19 ENCOUNTER — Encounter (HOSPITAL_COMMUNITY): Payer: Self-pay | Admitting: Anesthesiology

## 2018-10-19 ENCOUNTER — Ambulatory Visit (HOSPITAL_COMMUNITY)
Admission: RE | Admit: 2018-10-19 | Discharge: 2018-10-19 | Disposition: A | Discharge status: Home / Self Care | Payer: 59 | Source: Ambulatory Visit | Attending: Internal Medicine | Admitting: Internal Medicine

## 2018-10-19 ENCOUNTER — Encounter (HOSPITAL_COMMUNITY)
Admission: RE | Disposition: A | Discharge status: Home / Self Care | Payer: Self-pay | Source: Ambulatory Visit | Attending: Internal Medicine

## 2018-10-19 ENCOUNTER — Telehealth: Payer: Self-pay | Admitting: Internal Medicine

## 2018-10-19 ENCOUNTER — Ambulatory Visit (HOSPITAL_COMMUNITY): Payer: 59 | Admitting: Anesthesiology

## 2018-10-19 DIAGNOSIS — K449 Diaphragmatic hernia without obstruction or gangrene: Secondary | ICD-10-CM | POA: Insufficient documentation

## 2018-10-19 DIAGNOSIS — Z7982 Long term (current) use of aspirin: Secondary | ICD-10-CM | POA: Insufficient documentation

## 2018-10-19 DIAGNOSIS — K222 Esophageal obstruction: Secondary | ICD-10-CM | POA: Diagnosis not present

## 2018-10-19 DIAGNOSIS — K580 Irritable bowel syndrome with diarrhea: Secondary | ICD-10-CM | POA: Insufficient documentation

## 2018-10-19 DIAGNOSIS — R1310 Dysphagia, unspecified: Secondary | ICD-10-CM

## 2018-10-19 DIAGNOSIS — R1011 Right upper quadrant pain: Secondary | ICD-10-CM

## 2018-10-19 DIAGNOSIS — R12 Heartburn: Secondary | ICD-10-CM

## 2018-10-19 DIAGNOSIS — K2950 Unspecified chronic gastritis without bleeding: Secondary | ICD-10-CM | POA: Insufficient documentation

## 2018-10-19 DIAGNOSIS — K219 Gastro-esophageal reflux disease without esophagitis: Secondary | ICD-10-CM

## 2018-10-19 DIAGNOSIS — F329 Major depressive disorder, single episode, unspecified: Secondary | ICD-10-CM | POA: Insufficient documentation

## 2018-10-19 DIAGNOSIS — K210 Gastro-esophageal reflux disease with esophagitis: Secondary | ICD-10-CM | POA: Insufficient documentation

## 2018-10-19 DIAGNOSIS — R1314 Dysphagia, pharyngoesophageal phase: Secondary | ICD-10-CM | POA: Diagnosis not present

## 2018-10-19 DIAGNOSIS — R1319 Other dysphagia: Secondary | ICD-10-CM

## 2018-10-19 DIAGNOSIS — F419 Anxiety disorder, unspecified: Secondary | ICD-10-CM | POA: Insufficient documentation

## 2018-10-19 DIAGNOSIS — Z79899 Other long term (current) drug therapy: Secondary | ICD-10-CM | POA: Insufficient documentation

## 2018-10-19 HISTORY — PX: MALONEY DILATION: SHX5535

## 2018-10-19 HISTORY — PX: BIOPSY: SHX5522

## 2018-10-19 HISTORY — PX: ESOPHAGOGASTRODUODENOSCOPY (EGD) WITH PROPOFOL: SHX5813

## 2018-10-19 SURGERY — ESOPHAGOGASTRODUODENOSCOPY (EGD) WITH PROPOFOL
Anesthesia: Monitor Anesthesia Care

## 2018-10-19 MED ORDER — PROPOFOL 500 MG/50ML IV EMUL
INTRAVENOUS | Status: DC | PRN
  Administered 2018-10-19: 150 ug/kg/min via INTRAVENOUS

## 2018-10-19 MED ORDER — PROPOFOL 10 MG/ML IV BOLUS
INTRAVENOUS | Status: DC | PRN
  Administered 2018-10-19: 40 mg via INTRAVENOUS

## 2018-10-19 MED ORDER — LACTATED RINGERS IV SOLN
INTRAVENOUS | Status: DC
  Administered 2018-10-19: 13:00:00 via INTRAVENOUS

## 2018-10-19 MED ORDER — STERILE WATER FOR IRRIGATION IR SOLN
Status: DC | PRN
  Administered 2018-10-19: 1.5 mL

## 2018-10-19 MED ORDER — CHLORHEXIDINE GLUCONATE CLOTH 2 % EX PADS: 6.0000 | MEDICATED_PAD | Freq: Once | CUTANEOUS | Status: DC

## 2018-10-19 NOTE — Discharge Instructions (Signed)
EGD Discharge instructions Please read the instructions outlined below and refer to this sheet in the next few weeks. These discharge instructions provide you with general information on caring for yourself after you leave the hospital. Your doctor may also give you specific instructions. While your treatment has been planned according to the most current medical practices available, unavoidable complications occasionally occur. If you have any problems or questions after discharge, please call your doctor. ACTIVITY  You may resume your regular activity but move at a slower pace for the next 24 hours.   Take frequent rest periods for the next 24 hours.   Walking will help expel (get rid of) the air and reduce the bloated feeling in your abdomen.   No driving for 24 hours (because of the anesthesia (medicine) used during the test).   You may shower.   Do not sign any important legal documents or operate any machinery for 24 hours (because of the anesthesia used during the test).  NUTRITION  Drink plenty of fluids.   You may resume your normal diet.   Begin with a light meal and progress to your normal diet.   Avoid alcoholic beverages for 24 hours or as instructed by your caregiver.  MEDICATIONS  You may resume your normal medications unless your caregiver tells you otherwise.  WHAT YOU CAN EXPECT TODAY  You may experience abdominal discomfort such as a feeling of fullness or gas pains.  FOLLOW-UP  Your doctor will discuss the results of your test with you.  SEEK IMMEDIATE MEDICAL ATTENTION IF ANY OF THE FOLLOWING OCCUR:  Excessive nausea (feeling sick to your stomach) and/or vomiting.   Severe abdominal pain and distention (swelling).   Trouble swallowing.   Temperature over 101 F (37.8 C).   Rectal bleeding or vomiting of blood.    Stop omeprazole  Stop Pepto-Bismol  Begin Dexilant 60 mg daily-go by my office for free samples.  Take every day for 3 weeks then  let me know how you are doing  Further recommendations to follow pending review of pathology report  As discussed you may need further evaluation including CT scan in the near future however a lot of your discomfort may be related to acid reflux which we are going to treat with a different medication (see above)

## 2018-10-19 NOTE — Telephone Encounter (Signed)
thanks

## 2018-10-19 NOTE — Anesthesia Postprocedure Evaluation (Signed)
Anesthesia Post Note  Patient: Kayla White  Procedure(s) Performed: ESOPHAGOGASTRODUODENOSCOPY (EGD) WITH PROPOFOL (N/A ) MALONEY DILATION (N/A ) BIOPSY  Patient location during evaluation: PACU Anesthesia Type: MAC Level of consciousness: awake and alert and oriented Pain management: pain level controlled Vital Signs Assessment: post-procedure vital signs reviewed and stable Respiratory status: spontaneous breathing Cardiovascular status: stable Postop Assessment: no apparent nausea or vomiting Anesthetic complications: no     Last Vitals:  Vitals:   10/19/18 1304 10/19/18 1402  BP: 102/64   Pulse: 82 (P) 77  Resp: 16 (P) 20  Temp: 36.7 C (P) 36.6 C  SpO2: 100% (P) 100%    Last Pain:  Vitals:   10/19/18 1304  TempSrc: Oral  PainSc: 3                  Juliauna Stueve A

## 2018-10-19 NOTE — Transfer of Care (Signed)
Immediate Anesthesia Transfer of Care Note  Patient: Kayla White  Procedure(s) Performed: ESOPHAGOGASTRODUODENOSCOPY (EGD) WITH PROPOFOL (N/A ) MALONEY DILATION (N/A ) BIOPSY  Patient Location: PACU  Anesthesia Type:MAC  Level of Consciousness: awake, oriented and patient cooperative  Airway & Oxygen Therapy: Patient Spontanous Breathing  Post-op Assessment: Report given to RN and Post -op Vital signs reviewed and stable  Post vital signs: Reviewed and stable  Last Vitals:  Vitals Value Taken Time  BP 95/48 10/19/2018  2:02 PM  Temp    Pulse 79 10/19/2018  2:05 PM  Resp 20 10/19/2018  2:05 PM  SpO2 99 % 10/19/2018  2:05 PM  Vitals shown include unvalidated device data.  Last Pain:  Vitals:   10/19/18 1304  TempSrc: Oral  PainSc: 3       Patients Stated Pain Goal: 5 ([id number])  Complications: No apparent anesthesia complications

## 2018-10-19 NOTE — Telephone Encounter (Signed)
RMR called to say he is sending patient to pick up Dexilant samples (3 weeks worth).

## 2018-10-19 NOTE — Interval H&P Note (Signed)
History and Physical Interval Note:  10/19/2018 1:29 PM  Kayla White  has presented today for surgery, with the diagnosis of dysphagia, RUQ pain, poorly controlled gerd  The various methods of treatment have been discussed with the patient and family. After consideration of risks, benefits and other options for treatment, the patient has consented to  Procedure(s) with comments: ESOPHAGOGASTRODUODENOSCOPY (EGD) WITH PROPOFOL (N/A) - 2:30pm MALONEY DILATION (N/A) as a surgical intervention .  The patient's history has been reviewed, patient examined, no change in status, stable for surgery.  I have reviewed the patient's chart and labs.  Questions were answered to the patient's satisfaction.     Kayla White  No change.  EGD with ED as feasible/appropriate per plan.  The risks, benefits, limitations, alternatives and imponderables have been reviewed with the patient. Potential for esophageal dilation, biopsy, etc. have also been reviewed.  Questions have been answered. All parties agreeable.

## 2018-10-19 NOTE — Op Note (Signed)
[facility] Patient Name: Kayla White Procedure Date: 10/19/2018 1:04 PM MRN: [id number] Date of Birth: [birth year] Attending MD: Gennette Pac , MD CSN: [id number] Age: 47 Admit Type: Outpatient Procedure:                Upper GI endoscopy Indications:              Esophageal dysphagia, Heartburn Providers:                Gennette Pac, MD, Sterling Big, RN,                            Dyann Ruddle Referring MD:             Lucita Lora. Daniel MD, MD Medicines:                Propofol per Anesthesia Complications:            No immediate complications. Estimated Blood Loss:     Estimated blood loss was minimal. Procedure:                Pre-Anesthesia Assessment:                           - Prior to the procedure, a History and Physical                            was performed, and patient medications and                            allergies were reviewed. The patient's tolerance of                            previous anesthesia was also reviewed. The risks                            and benefits of the procedure and the sedation                            options and risks were discussed with the patient.                            All questions were answered, and informed consent                            was obtained. Prior Anticoagulants: The patient has                            taken no previous anticoagulant or antiplatelet                            agents. ASA Grade Assessment: II - A patient with                            mild systemic disease. After reviewing the risks  and benefits, the patient was deemed in                            satisfactory condition to undergo the procedure.                           After obtaining informed consent, the endoscope was                            passed under direct vision. Throughout the                            procedure, the patient's blood pressure, pulse, and                     oxygen saturations were monitored continuously. The                            GIF-H190 ([id number]) scope was introduced through the                            and advanced to the third part of duodenum. The                            upper GI endoscopy was accomplished without                            difficulty. The patient tolerated the procedure                            well. Scope In: 1:37:22 PM Scope Out: 1:49:57 PM Total Procedure Duration: 0 hours 12 minutes 35 seconds  Findings:      A mild Schatzki ring was found at the gastroesophageal junction. Tiny       erosions straddling the EG junction noted. Mild longitudinal furrows of       the tubular esophagus as well. .      A small hiatal hernia was present.      Multiple erosions were found in the entire examined stomach.      Diffuse erythematous mucosa was found in the entire examined stomach. No       ulcer or infiltrating process observed.      The duodenal bulb, second portion of the duodenum and third portion of       the duodenum were normal. The scope was withdrawn. Dilation was       performed with a Maloney dilator with no resistance at 52 Fr. The scope       was withdrawn. Dilation was performed with a Maloney dilator with mild       resistance at 54 Fr. The dilation site was examined following endoscope       reinsertion and showed no change. Estimated blood loss: none.       Subsequently, biopsied with a cold forceps for histology. Estimated       blood loss was minimal. Finally, gastric mucosa was biopsied with a cold       forceps for histology. Estimated blood loss was minimal. Impression:               -  Mild Schatzki ring. Dilated. Biopsied.                           - Small hiatal hernia.                           - Erosive gastropathy. Biopsied.                           - Erythematous mucosa in the stomach.                           - Normal duodenal bulb, second portion of the                             duodenum and third portion of the duodenum. Moderate Sedation:      Moderate (conscious) sedation was personally administered by an       anesthesia professional. The following parameters were monitored: oxygen       saturation, heart rate, blood pressure, respiratory rate, EKG, adequacy       of pulmonary ventilation, and response to care. Recommendation:           - Patient has a contact number available for                            emergencies. The signs and symptoms of potential                            delayed complications were discussed with the                            patient. Return to normal activities tomorrow.                            Written discharge instructions were provided to the                            patient.                           - Advance diet as tolerated.                           - Continue present medications. Stop Tums and                            Pepto-Bismol. 3-week trial of Dexilant 60 mg daily.                            Presenting symptoms somewhat out of proportion to                            objective findings but she did have abnormalities                            today. If  ongoing symptoms, may need to consider                            cross-sectional imaging of the chest and abdomen in                            the near future.                           - No repeat upper endoscopy.                           - Return to GI office (date not yet determined). Procedure Code(s):        --- Professional ---                           [phone], Esophagogastroduodenoscopy, flexible,                            transoral; with biopsy, single or multiple                           43450, Dilation of esophagus, by unguided sound or                            bougie, single or multiple passes Diagnosis Code(s):        --- Professional ---                           K22.2, Esophageal obstruction                           K44.9,  Diaphragmatic hernia without obstruction or                            gangrene                           K31.89, Other diseases of stomach and duodenum                           R13.14, Dysphagia, pharyngoesophageal phase                           R12, Heartburn CPT copyright 2018 American [employer] Association. All rights reserved. The codes documented in this report are preliminary and upon coder review may  be revised to meet current compliance requirements. [name] Konnor Vondrasek, MD Gennette Pac, MD 10/19/2018 2:04:16 PM This report has been signed electronically. Number of Addenda: 0

## 2018-10-19 NOTE — Anesthesia Preprocedure Evaluation (Signed)
Anesthesia Evaluation  Patient identified by MRN, date of birth, ID band Patient awake    Reviewed: Allergy & Precautions, H&P , NPO status , Patient's Chart, lab work & pertinent test results, reviewed documented beta blocker date and time   Airway Mallampati: II  TM Distance: >3 FB Neck ROM: full   Comment: TMJ  Dental no notable dental hx.    Pulmonary neg pulmonary ROS,    Pulmonary exam normal breath sounds clear to auscultation       Cardiovascular Exercise Tolerance: Good negative cardio ROS   Rhythm:regular Rate:Normal     Neuro/Psych  Headaches, PSYCHIATRIC DISORDERS Anxiety Depression  Neuromuscular disease negative neurological ROS  negative psych ROS   GI/Hepatic Neg liver ROS, GERD  ,  Endo/Other  negative endocrine ROS  Renal/GU negative Renal ROS  negative genitourinary   Musculoskeletal   Abdominal   Peds  Hematology negative hematology ROS (+)   Anesthesia Other Findings   Reproductive/Obstetrics negative OB ROS                             Anesthesia Physical  Anesthesia Plan  ASA: II  Anesthesia Plan: MAC   Post-op Pain Management:    Induction:   PONV Risk Score and Plan:   Airway Management Planned:   Additional Equipment:   Intra-op Plan:   Post-operative Plan:   Informed Consent: I have reviewed the patients History and Physical, chart, labs and discussed the procedure including the risks, benefits and alternatives for the proposed anesthesia with the patient or authorized representative who has indicated his/her understanding and acceptance.   Dental Advisory Given  Plan Discussed with: CRNA  Anesthesia Plan Comments:         Anesthesia Quick Evaluation

## 2018-10-19 NOTE — Telephone Encounter (Signed)
Dexilant 60 mg #20 at front for pt to pick up. She is to take one capsule daily 30 min prior to breakfast.

## 2018-10-20 ENCOUNTER — Telehealth: Payer: Self-pay | Admitting: Internal Medicine

## 2018-10-20 NOTE — Telephone Encounter (Signed)
Noted. Pt notified of RMR recommendations.  

## 2018-10-20 NOTE — Telephone Encounter (Signed)
[phone] PLEASE CALL PATIENT, SHE HAS QUESTIONS ABOUT THE INSTRUCTIONS THAT WERE GIVEN TO HER AFTER HER PROCEDURE.  SHE SAID SHE WAS TOLD SOMETHING DIFFERENT THAN HER PAPER INSTRUCTIONS STATE REGARDING HER MEDICATION.

## 2018-10-20 NOTE — Telephone Encounter (Signed)
Good question.  I would continue taking it for another 3 days and try to taper off of it right after Thanksgiving while continuing on Dexilant.

## 2018-10-20 NOTE — Telephone Encounter (Signed)
Pt would like to know if she should continue her Carafate medication. It wasn't listed on the discharge summary.

## 2018-10-22 ENCOUNTER — Encounter: Payer: Self-pay | Admitting: Internal Medicine

## 2018-10-26 ENCOUNTER — Encounter (HOSPITAL_COMMUNITY): Payer: Self-pay | Admitting: Internal Medicine

## 2018-10-26 ENCOUNTER — Telehealth: Payer: Self-pay

## 2018-10-26 NOTE — Telephone Encounter (Signed)
Pt called this morning with c/o s stabbing pain in her chest and back. It can be constant and sometimes she feels like she should pass gas and doesn't. Pt has been taking the Dexilant and finished the last does of Carafate on Sunday 10/25/18. She also saw that [place] had a [place]. I told her the letter was complete with her results but hadn't been released. She is aware that she doesn't have H. Pylori and wants to know what can cause her to have eosinophils.

## 2018-10-26 NOTE — Telephone Encounter (Signed)
Per RMR-  Send letter to patient.  Send copy of letter with path to referring provider and PCP. Let us arrange follow-up with [facility]SL in several weeks.  She is to call us in 3 weeks and let us know how the Dexilant is working for her

## 2018-10-27 ENCOUNTER — Other Ambulatory Visit: Payer: Self-pay

## 2018-10-27 ENCOUNTER — Encounter: Payer: Self-pay | Admitting: Gastroenterology

## 2018-10-27 MED ORDER — DEXLANSOPRAZOLE 60 MG PO CPDR: 60.0000 mg | DELAYED_RELEASE_CAPSULE | Freq: Every day | ORAL | 11 refills | Status: DC

## 2018-10-27 NOTE — Telephone Encounter (Signed)
Pt notified. Dexilant sent into her pharmacy. Pt was scheduled for 12/25/17 @ 11:00 AM.

## 2018-10-27 NOTE — Telephone Encounter (Signed)
Pt was notified of possible allergic reaction to something she is eating or drinking. Pt said she researched online and wants to know the number(level) of eosinophils that was found on the bx report. She said the number can make a difference of how long it takes you to heal per her Internet study. She also wants to discuss prescribing Cortisone since she wants to start the process of elimination for possible allergic reaction. Pt states she's suppose to be feeling better by Wednesday and doesn't. Is it ok to send Dexilant into her pharmacy?

## 2018-10-27 NOTE — Telephone Encounter (Signed)
PATIENT SCHEDULED AND LETTER SENT  °

## 2018-10-27 NOTE — Telephone Encounter (Signed)
Probably has an allergy to something  She is eating or drinking.

## 2018-10-27 NOTE — Telephone Encounter (Signed)
Up to 30/hpf.  I'd like her to stay on Dexilant 60 mg daily.  Call in a prescription dispense 30 with 11 refills.  She needs to get back in to see LSL, regarding her multiple questions and clinical progress.

## 2018-10-27 NOTE — Telephone Encounter (Signed)
Please send copy to PCP. Pt is aware of results. Another note has been sent to RMR. Pt needs an appointment with LSL.

## 2018-11-03 ENCOUNTER — Telehealth: Payer: Self-pay

## 2018-11-03 DIAGNOSIS — K200 Eosinophilic esophagitis: Secondary | ICD-10-CM

## 2018-11-03 NOTE — Telephone Encounter (Signed)
PA was approved, pt and pharmacy are aware. When the pharmacist checked on the price, it was $95.00. Pt can use a coupon that pt will pick up and activate first. Pt will stop by tomorrow to pick up card. Approval letter scanned.

## 2018-11-03 NOTE — Telephone Encounter (Signed)
Pt called back and needs a ref to see an allergist. She tried to schedule an appointment with [place] [facility] [place] but was told our office would have to ref her. She wants to see what she could be allergic to after her results were given from her recent procedure. Please advise.

## 2018-11-03 NOTE — Telephone Encounter (Signed)
PA was submitted through [email or web address]. Waiting on an approval or denial. PA was started on 10/30/18 and didn't submit successfully. Called pts insurance plan and they couldn't find pt in the system. PA was submitted again with the new info pt gave(ID [id number]).   Samples are ready for pickup, while pt is waiting on approval or denial.

## 2018-11-04 NOTE — Telephone Encounter (Signed)
Referral sent to [facility] in [place] via Epic.

## 2018-11-04 NOTE — Addendum Note (Signed)
Addended by: Sandria SenterBOOTH, Romeo Zielinski C on: 11/04/2018 10:27 AM   Modules accepted: Orders

## 2018-11-04 NOTE — Telephone Encounter (Signed)
I assume the below information about medication is regarding Dexilant.   OK to refer to allergist for possible eosinophilic esophagitis

## 2018-11-04 NOTE — Telephone Encounter (Signed)
Please ref pt to allergist for possible eosinophilic esophagitis. Pt would like to go to an allergist in [place] that's apart of [facility].

## 2018-11-10 ENCOUNTER — Ambulatory Visit: Payer: 59 | Admitting: Allergy and Immunology

## 2018-11-11 ENCOUNTER — Encounter: Payer: Self-pay | Admitting: Allergy & Immunology

## 2018-11-11 ENCOUNTER — Ambulatory Visit (INDEPENDENT_AMBULATORY_CARE_PROVIDER_SITE_OTHER): Payer: 59 | Admitting: Allergy & Immunology

## 2018-11-11 VITALS — BP 96/70 | HR 60 | Temp 98.1°F | Resp 16 | Ht 69.0 in | Wt 159.0 lb

## 2018-11-11 DIAGNOSIS — T63481D Toxic effect of venom of other arthropod, accidental (unintentional), subsequent encounter: Secondary | ICD-10-CM

## 2018-11-11 DIAGNOSIS — L2089 Other atopic dermatitis: Secondary | ICD-10-CM

## 2018-11-11 DIAGNOSIS — K200 Eosinophilic esophagitis: Secondary | ICD-10-CM

## 2018-11-11 DIAGNOSIS — J302 Other seasonal allergic rhinitis: Secondary | ICD-10-CM | POA: Diagnosis not present

## 2018-11-11 NOTE — Progress Notes (Addendum)
NEW PATIENT  Date of Service/Encounter:  11/11/18  Referring provider: Caryl Bis, MD   Assessment:   Eosinophilic esophagitis - with no reactivities to patient selected foods today (including the most common food allergens)  Insect sting allergy anaphylaxis   Seasonal allergic rhinitis - did not do testing today  Eczema - controlled with rare use of clobetasol   Kayla White is a delightful 47 year old female presenting for an evaluation of eosinophilic esophagitis.  She was already very well read on the topic and realized that IgE testing for foods does not necessarily correlate with symptoms.  However, we discussed this further today.  Unfortunately, her testing today was negative despite a positive histamine control.  She did request some blood work to confirm this.  This seems reasonable since she has met her deductible and she would rather pursue dietary changes rather than take an oral steroid.  We will call her in 1 to 2 weeks with the results of this testing.  However, we did have a long discussion about the 6 food elimination diet.  She was not keen on pursuing this at this time.  She was continuing to have fairly severe symptoms, so we did discuss swallowed steroids as well.  She is going to touch base with Dr. Gala Romney to confirm which route he wanted to take regarding treatment.  I did provide her with some more literature on eosinophilic esophagitis.  She is obviously very inquisitive and had quite a few questions.  We are going to check for evidence of IgE to stinging insects.  We briefly discussed venom immunotherapy.  However, she prefers avoidance with epinephrine carriage instead.  We did not think that environmental allergy testing was useful since she controls her symptoms with an occasional antihistamine only.  Her eczema appears well controlled aside from some very dry hands.    Plan/Recommendations:   1. Eosinophilic esophagitis - Food allergen skin  prick tests were negative today despite a positive histamine control.  - The negative predictive value for food allergen skin testing is excellent, with the exception of milk. - We will get blood testing to confirm these findings today.  - We will call you in 1-2 weeks with the results of the testing.  - However, false negatives may occur, particularly with milk.  - Therefore if symptoms persist or progress, a trial six food elimination diet (milk, soy, eggs, wheat, peanuts/tree nuts, and seafood), or at least a trial elimination of milk, is a reasonable consideration. - Alternatively, if you would like to try the swallowed steroid instead before working on your foods, that is completely fine as well. - I would want to make sure that Dr. Buford Dresser is on board with the plan.   2. Insect sting allergy - EpiPen is up to date.  - We can get some venom testing in the blood if you are interested in doing this.   3. Return in about 6 months (around 05/13/2019).   Subjective:   Kayla White is a 47 y.o. female presenting today for evaluation of  Chief Complaint  Patient presents with  . Nasal Congestion    Kayla White has a history of the following: Patient Active Problem List   Diagnosis Date Noted  . Flexural atopic dermatitis 11/13/2018  . Seasonal allergic rhinitis 11/13/2018  . Insect sting allergy 11/13/2018  . Eosinophilic esophagitis 46/96/2952  . RUQ pain 10/13/2018  . GERD (gastroesophageal reflux disease) 10/13/2018  . Esophageal dysphagia 10/13/2018  . Biliary sludge   .  Rectal bleeding 07/09/2018  . Dyspepsia 07/09/2018  . Cervical strain 06/04/2016  . Cervicogenic headache 06/04/2016    History obtained from: chart review and patient.  Kayla White was referred by Caryl Bis, MD.     Kayla White is a 47 y.o. female presenting for an evaluation of possible food allergies due to a recent diagnosis of EoE.   She started having  problems in May 2019 with food impaction and burning. She had a HIDA scan and gallbladder removal. She had a couple of EKGs and colonoscopy and endoscopy. She actually had two endoscopies performed within three months, and the first one performed was negative. The first one was done in [place] but Dr. Roseanne Kaufman clinical suspicion was so high that he did another one. Results are below:     Eventually she was diagnosed with EoE. She was placed on Dexilant and recommended allergy testing for triggering foods. She does not notice that a certain food triggers her symptoms. Everything seems to hurt now, so she is unsure that there is a particular worse food.  She is going back to see Dr. Gala Romney on January 22.  She has had four bad episodes. During one, she had eaten eggs that morning. She had another one that was thought to be an MI around the time that she had eaten a burger with a large wheat bun. She otherwise eats all of the major food allergens without adverse effect.   She did spend the night at the hospital when she was bit by a yellow jacket at a football game.  She has never had venom testing performed. She was bit on her tongue and she developed throat swelling.   She does have some eczema which is treated with clobetasol.  She ends up using it around once a week at the most.  The worst areas is on her hands.  She does have some seasonal allergies which are treated with an over-the-counter antihistamine during the spring season.  Otherwise, there is no history of other atopic diseases, including asthma, drug allergies or urticaria. There is no significant infectious history. Vaccinations are up to date.    Past Medical History: Patient Active Problem List   Diagnosis Date Noted  . Flexural atopic dermatitis 11/13/2018  . Seasonal allergic rhinitis 11/13/2018  . Insect sting allergy 11/13/2018  . Eosinophilic esophagitis 09/32/6712  . RUQ pain 10/13/2018  . GERD (gastroesophageal reflux disease)  10/13/2018  . Esophageal dysphagia 10/13/2018  . Biliary sludge   . Rectal bleeding 07/09/2018  . Dyspepsia 07/09/2018  . Cervical strain 06/04/2016  . Cervicogenic headache 06/04/2016    Medication List:  Allergies as of 11/11/2018      Reactions   Bee Venom Anaphylaxis, Swelling   Shellfish Allergy Hives   Penicillins Rash   Has patient had a PCN reaction causing immediate rash, facial/tongue/throat swelling, SOB or lightheadedness with hypotension: Unknown Has patient had a PCN reaction causing severe rash involving mucus membranes or skin necrosis: Unknown Has patient had a PCN reaction that required hospitalization: No Has patient had a PCN reaction occurring within the last 10 years: No CHILDHOOD REACTION If all of the above answers are "NO", then may proceed with Cephalosporin use.      Medication List       Accurate as of November 11, 2018 11:59 PM. Always use your most recent med list.        acetaminophen 500 MG tablet Commonly known as:  TYLENOL Take 500-1,000 mg by mouth  every 6 (six) hours as needed (for pain.).   dexlansoprazole 60 MG capsule Commonly known as:  DEXILANT Take 1 capsule (60 mg total) by mouth daily.   halobetasol 0.05 % cream Commonly known as:  ULTRAVATE Apply 1 application topically 2 (two) times daily as needed (for ezcema).   multivitamin with minerals Tabs tablet Take 1 tablet by mouth daily.   PROBIOTIC PO Take 1 capsule by mouth daily.   sertraline 100 MG tablet Commonly known as:  ZOLOFT Take 100 mg by mouth at bedtime.       Birth History: non-contributory  Developmental History: non-contributory.   Past Surgical History: Past Surgical History:  Procedure Laterality Date  . BIOPSY  10/19/2018   Procedure: BIOPSY;  Surgeon: Daneil Dolin, MD;  [facility];  Service: Endoscopy;;  gastric  . CHOLECYSTECTOMY N/A 08/14/2018   Procedure: LAPAROSCOPIC CHOLECYSTECTOMY;  Surgeon: Aviva Signs, MD;  Location:  [facility];  Service: General;  Laterality: N/A;  . COLONOSCOPY WITH ESOPHAGOGASTRODUODENOSCOPY (EGD)  06/2018   Dr. Anthony Sar: EGD "normal".  Colonoscopy with hemorrhoids.  Marland Kitchen DILATION AND CURETTAGE OF UTERUS  2009  . ESOPHAGOGASTRODUODENOSCOPY (EGD) WITH PROPOFOL N/A 10/19/2018   Procedure: ESOPHAGOGASTRODUODENOSCOPY (EGD) WITH PROPOFOL;  Surgeon: Daneil Dolin, MD;  [facility]: [facility];  Service: Endoscopy;  Laterality: N/A;  2:30pm  . IVF  2007, 099 amd 2010  . LAPAROSCOPIC ABDOMINAL EXPLORATION     R/o endometriosis  . MALONEY DILATION N/A 10/19/2018   Procedure: Venia Minks DILATION;  Surgeon: Daneil Dolin, MD;  Location: [facility];  Service: Endoscopy;  Laterality: N/A;     Family History: Family History  Problem Relation Age of Onset  . Congestive Heart Failure Father   . Arthritis Mother   . Hypertension Mother   . Alzheimer's disease Paternal Grandmother   . Colon cancer Neg Hx   . Colon polyps Neg Hx      Social History: Caroleena lives at home with her family.  She lives in a house that is 46 years old.  There is hardwood in the main living areas and carpeting in the bedrooms.  She has gas and electric heating with central cooling.  There is an outdoor cat.  There are dust mite covers on the bedding.  There is no tobacco exposure.  She currently works as a Cabin crew for the past 3 years.    Review of Systems: a 14-point review of systems is pertinent for what is mentioned in HPI.  Otherwise, all other systems were negative. Constitutional: negative other than that listed in the HPI Eyes: negative other than that listed in the HPI Ears, nose, mouth, throat, and face: negative other than that listed in the HPI Respiratory: negative other than that listed in the HPI Cardiovascular: negative other than that listed in the HPI Gastrointestinal: negative other than that listed in the HPI Genitourinary: negative other than that listed in the HPI Integument: negative other  than that listed in the HPI Hematologic: negative other than that listed in the HPI Musculoskeletal: negative other than that listed in the HPI Neurological: negative other than that listed in the HPI Allergy/Immunologic: negative other than that listed in the HPI    Objective:   Blood pressure 96/70, pulse 60, temperature 98.1 F (36.7 C), temperature source Oral, resp. rate 16, height '5\' 9"'  (1.753 m), weight 159 lb (72.1 kg), SpO2 98 %. Body mass index is 23.48 kg/m.   Physical Exam:  General: Alert, interactive, in no  acute distress. Talkative.  Eyes: No conjunctival injection bilaterally, no discharge on the right, no discharge on the left and no Horner-Trantas dots present. PERRL bilaterally. EOMI without pain. No photophobia.  Ears: Right TM pearly gray with normal light reflex, Left TM pearly gray with normal light reflex, Right TM intact without perforation and Left TM intact without perforation.  Nose/Throat: External nose within normal limits and septum midline. Turbinates edematous and pale with clear discharge. Posterior oropharynx erythematous without cobblestoning in the posterior oropharynx. Tonsils 2+ without exudates.  Tongue without thrush. Neck: Supple without thyromegaly. Trachea midline. Adenopathy: no enlarged lymph nodes appreciated in the anterior cervical, occipital, axillary, epitrochlear, inguinal, or popliteal regions. Lungs: Clear to auscultation without wheezing, rhonchi or rales. No increased work of breathing. CV: Normal S1/S2. No murmurs. Capillary refill <2 seconds.  Abdomen: Nondistended, nontender. No guarding or rebound tenderness. Bowel sounds present in all fields and hyperactive  Skin: Warm and dry, without lesions or rashes. However she does have some dry patches on her bilateral hands. Extremities:  No clubbing, cyanosis or edema. Neuro:   Grossly intact. No focal deficits appreciated. Responsive to questions.  Diagnostic studies:   Allergy  Studies:   Food Adult Perc - 11/11/18 1500    Time Antigen Placed  1514    Allergen Manufacturer  Lavella Hammock    Location  Back    Number of allergen test  37     Control-buffer 50% Glycerol  Negative    Control-Histamine 1 mg/ml  2+    1. Peanut  Negative    2. Soybean  Negative    3. Wheat  Negative    4. Sesame  Negative    5. Milk, cow  Negative    6. Egg White, Chicken  Negative    7. Casein  Negative    8. Shellfish Mix  Negative    9. Fish Mix  Negative    10. Cashew  Negative    11. Pecan Food  Negative    12. [place]  Negative    13. Almond  Negative    14. Hazelnut  Negative    15. [place] nut  Negative    16. Coconut  Negative    23. Flounder  Negative    25. Shrimp  Negative    30. Barley  Negative    31. Oat   Negative    32. Rye   Negative    34. Rice  Negative    37. Pork  Negative    38. [place] Meat  Negative    39. Chicken Meat  Negative    40. Beef  Negative    42. Tomato  Negative    44. Sweet Potato  Negative    54. Cucumber  Negative    64. Chocolate/Cacao bean  Negative    65. Karaya Gum  Negative    66. Acacia (Arabic Gum)  Negative    67. Cinnamon  Negative    69. Ginger  Negative    70. Garlic  Negative    71. Pepper, black  Negative    72. Mustard  Negative        Allergy testing results were read and interpreted by myself, documented by clinical staff.       Salvatore Marvel, MD [facility]

## 2018-11-11 NOTE — Patient Instructions (Addendum)
1. Eosinophilic esophagitis - Food allergen skin prick tests were negative today despite a positive histamine control.  - The negative predictive value for food allergen skin testing is excellent, with the exception of milk. - We will get blood testing to confirm these findings today.  - We will call you in 1-2 weeks with the results of the testing.  - However, false negatives may occur, particularly with milk.  - Therefore if symptoms persist or progress, a trial six food elimination diet (milk, soy, eggs, wheat, peanuts/tree nuts, and seafood), or at least a trial elimination of milk, is a reasonable consideration. - Alternatively, if you would like to try the swallowed steroid instead before working on your foods, that is completely fine as well. - I would want to make sure that Dr. Benard Rinkoark is on board with the plan.   2. Insect sting allergy - EpiPen is up to date.  - We can get some venom testing in the blood if you are interested in doing this.   3. Return in about 6 months (around 05/13/2019).   Please inform us of any Emergency Department visits, hospitalizations, or changes in symptoms. Call us before going to the ED for breathing or allergy symptoms since we might be able to fit you in for a sick visit. Feel free to contact us anytime with any questions, problems, or concerns.  It was a pleasure to meet you today!  Websites that have reliable patient information: 1. American Academy of Asthma, Allergy, and Immunology: www.aaaai.org 2. Food Allergy Research and Education (FARE): foodallergy.org 3. Mothers of Asthmatics: http://www.asthmacommunitynetwork.org 4. American College of Allergy, Asthma, and Immunology: MissingWeapons.cawww.acaai.org   Make sure you are registered to vote! If you have moved or changed any of your contact information, you will need to get this updated before voting!

## 2018-11-13 ENCOUNTER — Encounter: Payer: Self-pay | Admitting: Allergy & Immunology

## 2018-11-13 DIAGNOSIS — L2089 Other atopic dermatitis: Secondary | ICD-10-CM | POA: Insufficient documentation

## 2018-11-13 DIAGNOSIS — J302 Other seasonal allergic rhinitis: Secondary | ICD-10-CM | POA: Insufficient documentation

## 2018-11-13 DIAGNOSIS — T63481D Toxic effect of venom of other arthropod, accidental (unintentional), subsequent encounter: Secondary | ICD-10-CM | POA: Insufficient documentation

## 2018-11-13 DIAGNOSIS — K200 Eosinophilic esophagitis: Secondary | ICD-10-CM | POA: Insufficient documentation

## 2018-11-20 ENCOUNTER — Telehealth: Payer: Self-pay

## 2018-11-20 NOTE — Telephone Encounter (Signed)
Pt had allergy testing and wasn't allergic to anything. They sent off for blood work that will take 2 weeks to come back. Pt did eat a little different over the holiday's and has some pain that's in her back and she feels the symptoms in her chest. She is taking Dexilant once daily before breakfast.  Pt states that the allergist mentioned a steroid to calm the symptoms/pain down. The allergist was leaving the steroid up to RMR per pt. Pt also said that RMR mentioned a CT scan and she is now open to having that done since the allergy testing showed no allergies. Please advise.

## 2018-11-23 ENCOUNTER — Other Ambulatory Visit (HOSPITAL_COMMUNITY)
Admission: RE | Admit: 2018-11-23 | Discharge: 2018-11-23 | Disposition: A | Discharge status: Home / Self Care | Payer: 59 | Source: Ambulatory Visit | Attending: Allergy & Immunology | Admitting: Allergy & Immunology

## 2018-11-23 ENCOUNTER — Telehealth: Payer: Self-pay | Admitting: Allergy & Immunology

## 2018-11-23 DIAGNOSIS — K200 Eosinophilic esophagitis: Secondary | ICD-10-CM

## 2018-11-23 NOTE — Telephone Encounter (Signed)
Patient called and requested with get a CBC with Diff as well with her other blood test. Order was placed for test.

## 2018-11-23 NOTE — Telephone Encounter (Signed)
Pt called and said that she has to go for blood work this morning and would like for them to do a blood level. [phone].

## 2018-11-23 NOTE — Telephone Encounter (Signed)
Pt left a message stating she did a lot of research over the weekend and wanted a test added on to her blood work that she was having done at the allergist. When I called pt back, she said she had the allergist add the lab needed. Pt said she has trouble getting winded after walking up steps. Pt was advised to go to the ED if she's having SOB and chest pains. Pt is aware that RMR isn't in the office today.

## 2018-11-24 LAB — CBC WITH DIFFERENTIAL/PLATELET
BASOS: 1 %
Basophils Absolute: 0 10*3/uL (ref 0.0–0.2)
EOS (ABSOLUTE): 0.1 10*3/uL (ref 0.0–0.4)
EOS: 4 %
HEMATOCRIT: 41.2 % (ref 34.0–46.6)
HEMOGLOBIN: 13.8 g/dL (ref 11.1–15.9)
IMMATURE GRANS (ABS): 0 10*3/uL (ref 0.0–0.1)
Immature Granulocytes: 0 %
LYMPHS: 38 %
Lymphocytes Absolute: 1.2 10*3/uL (ref 0.7–3.1)
MCH: 28.6 pg (ref 26.6–33.0)
MCHC: 33.5 g/dL (ref 31.5–35.7)
MCV: 85 fL (ref 79–97)
Monocytes Absolute: 0.3 10*3/uL (ref 0.1–0.9)
Monocytes: 9 %
NEUTROS ABS: 1.5 10*3/uL (ref 1.4–7.0)
Neutrophils: 48 %
Platelets: 152 10*3/uL (ref 150–450)
RBC: 4.83 x10E6/uL (ref 3.77–5.28)
RDW: 12.5 % (ref 12.3–15.4)
WBC: 3.1 10*3/uL — ABNORMAL LOW (ref 3.4–10.8)

## 2018-11-24 NOTE — Telephone Encounter (Signed)
Noted. Spoke with pt and she is in agreement.

## 2018-11-24 NOTE — Telephone Encounter (Signed)
Patient needs to deal with dyspnea and allergy issues first.  Then circle back around and follow-up with us

## 2018-11-24 NOTE — Telephone Encounter (Signed)
Sounds good. No problem.  Malachi BondsJoel Terran Klinke, MD [facility]

## 2018-11-26 ENCOUNTER — Ambulatory Visit: Payer: 59 | Admitting: Allergy

## 2018-11-27 LAB — ALLERGEN PROFILE, SHELLFISH
Clam IgE: <0.1 kU/L
F290-IgE Oyster: <0.1 kU/L

## 2018-11-27 LAB — ALPHA-GAL PANEL
Beef (Bos spp) IgE: <0.1 kU/L (ref <0.35)
Class Interpretation: 0
Class Interpretation: 0
LAMB CLASS INTERPRETATION: 0
Lamb/Mutton (Ovis spp) IgE: <0.1 kU/L (ref <0.35)

## 2018-11-27 LAB — ALLERGEN PROFILE, FOOD-FISH
Allergen Mackerel IgE: <0.1 kU/L
Allergen Trout IgE: <0.1 kU/L
Allergen Walley Pike IgE: <0.1 kU/L
Codfish IgE: <0.1 kU/L
Halibut IgE: <0.1 kU/L

## 2018-11-27 LAB — ALLERGEN, WHEAT, F4: WHEAT IGE: 1.28 kU/L — AB

## 2018-11-27 LAB — ALLERGENS(7)
F202-IgE Cashew Nut: <0.1 kU/L
PEANUT IGE: 0.2 kU/L — AB
Walnut IgE: <0.1 kU/L

## 2018-11-27 LAB — ALLERGEN SOYBEAN: Soybean IgE: <0.1 kU/L

## 2018-11-27 LAB — ALLERGEN SESAME F10: SESAME SEED IGE: 0.13 kU/L — AB

## 2018-11-27 LAB — ALLERGEN, RICE, F9: Allergen Rice IgE: <0.1 kU/L

## 2018-11-27 LAB — ALLERGEN CHOCOLATE: Chocolate/Cacao IgE: <0.1 kU/L

## 2018-11-27 LAB — ALLERGEN STINGING INSECT PANEL
Honeybee IgE: <0.1 kU/L
Hornet, White Face, IgE: 0.79 kU/L — AB
Hornet, Yellow, IgE: 0.36 kU/L — AB
I003-IGE YELLOW JACKET: 1.22 kU/L — AB
Paper Wasp IgE: 0.69 kU/L — AB

## 2018-11-27 LAB — ALLERGEN, TURKEY, F284: Allergen Turkey IgE: <0.1 kU/L

## 2018-11-27 LAB — ALLERGEN COCONUT IGE: Allergen Coconut IgE: 0.22 kU/L — AB

## 2018-11-27 LAB — TRYPTASE: Tryptase: 2.7 ug/L (ref 2.2–13.2)

## 2018-11-27 LAB — ALLERGEN,OAT,F7: F007-IGE OAT: 0.11 kU/L — AB

## 2018-11-27 LAB — ALLERGEN, GARLIC, F47: F047-IGE GARLIC: 0.19 kU/L — AB

## 2018-11-27 LAB — F245-IGE EGG, WHOLE: Egg, Whole IgE: 0.14 kU/L — AB

## 2018-11-27 LAB — F297-IGE ACACIA GUM

## 2018-11-27 LAB — ALLERGEN MILK: Milk IgE: 0.89 kU/L — AB

## 2018-11-27 LAB — ALLERGEN, MUSTARD, F89: F089-IgE Mustard: <0.1 kU/L

## 2018-11-27 LAB — ALLERGEN, CASEIN, F78: F078-IgE Casein: 0.14 kU/L — AB

## 2018-11-27 LAB — ALLERGEN, CUCUMBER, F244: Allergen Cucumber IgE: 0.1 kU/L — AB

## 2018-11-27 LAB — ALLERGEN BARLEY F6: Allergen Barley IgE: 0.58 kU/L — AB

## 2018-11-27 LAB — ALLERGEN, SWEET POTATO,F54: Allergen Sweet Potato IgE: <0.1 kU/L

## 2018-11-27 LAB — ALLERGEN, BLACK PEPPER,F280

## 2018-11-27 LAB — ALLERGEN PISTACHIO F203: F203-IgE Pistachio Nut: <0.1 kU/L

## 2018-11-27 LAB — ALLERGEN, GINGER, RF270: Allergen Ginger IgE: 0.1 kU/L — AB

## 2018-11-27 LAB — ALLERGEN, TOMATO F25: ALLERGEN TOMATO, IGE: 0.54 kU/L — AB

## 2018-11-27 LAB — ALLERGEN, RYE, F5: Rye IgE: 0.8 kU/L — AB

## 2018-11-27 LAB — ALLERGEN, CHICKEN F83: Chicken IgE: <0.1 kU/L

## 2018-11-27 LAB — ALLERGEN, CINNAMON, RF220: Allergen Cinnamon IgE: <0.1 kU/L

## 2018-12-07 ENCOUNTER — Telehealth: Payer: Self-pay | Admitting: Allergy & Immunology

## 2018-12-07 NOTE — Telephone Encounter (Signed)
Left vm advising results were sent via mychart. Requested call back to discuss results if desired.

## 2018-12-07 NOTE — Telephone Encounter (Signed)
Patient had labs two weeks ago and would like results if they are in.

## 2018-12-25 ENCOUNTER — Encounter: Payer: Self-pay | Admitting: Gastroenterology

## 2018-12-25 ENCOUNTER — Ambulatory Visit: Payer: 59 | Admitting: Gastroenterology

## 2018-12-25 VITALS — BP 107/67 | HR 63 | Temp 98.2°F | Ht 69.0 in | Wt 170.4 lb

## 2018-12-25 DIAGNOSIS — K200 Eosinophilic esophagitis: Secondary | ICD-10-CM

## 2018-12-25 DIAGNOSIS — K219 Gastro-esophageal reflux disease without esophagitis: Secondary | ICD-10-CM | POA: Diagnosis not present

## 2018-12-25 MED ORDER — FLUTICASONE PROPIONATE HFA 220 MCG/ACT IN AERO: INHALATION_SPRAY | RESPIRATORY_TRACT | 1 refills | Status: DC

## 2018-12-25 NOTE — Patient Instructions (Signed)
Continue to avoid food triggers.   Continue Dexilant for now.   Start fluticasone , two sprays into the mouth and swallow twice a day. Do not eat or drink for 30 minutes afterwards. Try for at least 2 weeks. If improvement, continue for 6-8 weeks.   Return to see Dr. Jena Gauss in 2 months.

## 2018-12-25 NOTE — Progress Notes (Signed)
Primary Care Physician: Richardean Chimeraaniel, Terry G, MD  Primary Gastroenterologist:  Roetta SessionsMichael Rourk, MD   Chief Complaint  Patient presents with  . Gastroesophageal Reflux    occ    HPI: Kayla White is a 48 y.o. female here for follow-up. She has h/o substernal chest pain with radiation into back, dysphagia to solid food and pills, heartburn. She had EGD since her last OV.  Found to have mild Schatzki ring, mild longitudinal furrows of the tubular esophagus, tiny distal esophageal erosions, gastritis.  Esophagus dilated up to 54 [place].  Esophageal biopsies showed esophagitis with up to 30 per high-power field intraepithelial eosinophils.  She saw Dr. Dellis AnesGallagher her allergist since we last saw her.  Food allergen skin prick test was negative.  Blood testing was performed.  Trial 6 food elimination diet including milk, soy, eggs, wheat, peanut/tree nuts, seafood or at least to try elimination of milk was suggested by her allergist but she reportedly was not keen on elimination diet and preferred testing.  She was found to have insect sting allergy, EpiPen up-to-date. Blood work showed positivity to peanut, egg, milk, oat, barley, rye, wheat, tomato, cucumber, ginger, garlic, sesame, coconut.   Patient has completely eliminated certain foods and noted a lot of improvement in her symptoms. Less problems with substernal chest pain radiating into her back. Less burning and improved swallowing but not completely resolved. She no longer eats eggs or wheat as they are significant triggers. She had one of her worst episodes almost prompting ED visit after eating eggs. She reports being tested for celiac disease years ago and was negative. She notes her breathing is better now as well. PCP placed her on singulair.  Continues to have alternating constipation/diarrhea which is baseline. No melena, brbpr.     Current Outpatient Medications  Medication Sig Dispense Refill  . acetaminophen  (TYLENOL) 500 MG tablet Take 500-1,000 mg by mouth every 6 (six) hours as needed (for pain.).     Marland Kitchen. dexlansoprazole (DEXILANT) 60 MG capsule Take 1 capsule (60 mg total) by mouth daily. 30 capsule 11  . halobetasol (ULTRAVATE) 0.05 % cream Apply 1 application topically 2 (two) times daily as needed (for ezcema).    . montelukast (SINGULAIR) 10 MG tablet Take 1 tablet by mouth daily.    . Multiple Vitamin (MULTIVITAMIN WITH MINERALS) TABS tablet Take 1 tablet by mouth daily.    . sertraline (ZOLOFT) 100 MG tablet Take 100 mg by mouth at bedtime.      No current facility-administered medications for this visit.     Allergies as of 12/25/2018 - Review Complete 12/25/2018  Allergen Reaction Noted  . Bee venom Anaphylaxis and Swelling 08/10/2018  . Shellfish allergy Hives 10/16/2018  . Penicillins Rash 06/04/2016    ROS:  General: Negative for anorexia, weight loss, fever, chills, fatigue, weakness. ENT: Negative for hoarseness, , nasal congestion.see hpi CV: Negative for chest pain, angina, palpitations, dyspnea on exertion, peripheral edema.  Respiratory: Negative for dyspnea at rest, dyspnea on exertion, cough, sputum, wheezing. See hpi GI: See history of present illness. GU:  Negative for dysuria, hematuria, urinary incontinence, urinary frequency, nocturnal urination.  Endo: Negative for unusual weight change.    Physical Examination:   BP 107/67   Pulse 63   Temp 98.2 F (36.8 C) (Oral)   Ht 5\' 9"  (1.753 m)   Wt 170 lb 6.4 oz (77.3 kg)   LMP 12/10/2018 (Exact Date)   BMI 25.16 kg/m  General: Well-nourished, well-developed in no acute distress.  Eyes: No icterus. Mouth: Oropharyngeal mucosa moist and pink , no lesions erythema or exudate. Lungs: Clear to auscultation bilaterally.  Heart: Regular rate and rhythm, no murmurs rubs or gallops.  Abdomen: Bowel sounds are normal, nontender, nondistended, no hepatosplenomegaly or masses, no abdominal bruits or hernia , no  rebound or guarding.   Extremities: No lower extremity edema. No clubbing or deformities. Neuro: Alert and oriented x 4   Skin: Warm and dry, no jaundice.   Psych: Alert and cooperative, normal mood and affect.  Labs:  Lab Results  Component Value Date   WBC 3.1 (L) 11/23/2018   HGB 13.8 11/23/2018   HCT 41.2 11/23/2018   MCV 85 11/23/2018   PLT 152 11/23/2018       Imaging Studies: No results found.

## 2018-12-28 ENCOUNTER — Encounter: Payer: Self-pay | Admitting: Gastroenterology

## 2018-12-28 ENCOUNTER — Encounter: Payer: Self-pay | Admitting: Internal Medicine

## 2018-12-28 NOTE — Progress Notes (Signed)
cc'ed to pcp °

## 2018-12-28 NOTE — Assessment & Plan Note (Signed)
Very pleasant 48 year old lady with history of substernal chest pain/radiation to the back, burning, solid food/pill dysphagia with recent EGD showing Schatzki ring status post dilation, findings suggestive of eosinophilic esophagitis.  Skin prick allergy testing unremarkable per patient but blood testing showed some positivity with regards to food allergies and insect stings as listed above.  She has eliminated some of these food triggers completely with some improvement in symptoms.  She would like to consider medication treatment for ongoing symptoms.  She has been on Dexilant since her procedure.  Would offer her fluticasone 220 mcg spray 2 sprays in the mouth, swallow twice daily.  Do not drink for 30 minutes after the medication.  We will have her come back for follow-up in 2 months or call sooner if needed.

## 2019-01-01 ENCOUNTER — Ambulatory Visit: Payer: Self-pay | Admitting: Gastroenterology

## 2019-01-14 ENCOUNTER — Telehealth: Payer: Self-pay | Admitting: Gastroenterology

## 2019-01-14 NOTE — Telephone Encounter (Signed)
Please let patient know that I would like for her to update her LFTs. When reviewing her LFTs done around time of getting her gb out they were minimally elevated.   If she has had them repeated since then with her PCP, we can try to get a copy.

## 2019-01-18 ENCOUNTER — Other Ambulatory Visit: Payer: Self-pay

## 2019-01-18 DIAGNOSIS — R7989 Other specified abnormal findings of blood chemistry: Secondary | ICD-10-CM

## 2019-01-18 DIAGNOSIS — R945 Abnormal results of liver function studies: Secondary | ICD-10-CM

## 2019-01-18 NOTE — Telephone Encounter (Signed)
Spoke with pt, lab orders placed for [facility] per pts request. Pt also mentioned that she hasn't started the steroid inhaler due to having to pay the full price due to her deductible. The price is $500 + and pt isn't sure if there is another inhaler she can try?

## 2019-01-19 ENCOUNTER — Telehealth: Payer: Self-pay | Admitting: Internal Medicine

## 2019-01-19 NOTE — Telephone Encounter (Signed)
Called [facility], the coumpound vicious budesonide 2 mg daily for 2 weeks at a time. Pt can have refills and the medication is $40.00 for a 2 week supply. [facility] waiting on a return call to discuss with pt.

## 2019-01-19 NOTE — Telephone Encounter (Signed)
LSL. [facility] is going to compound vicious Budesonide 2 mg daily for 2 weeks with 5 rfs to complete 12 weeks. [facility] can only compound that medications for 2 weeks at a time. Pt is aware that it is $40 per 2 weeks and is ok with this RX.

## 2019-01-19 NOTE — Telephone Encounter (Signed)
Please call [facility] and see if they can compound a viscous oral budesonide solution for patient. She needs 2mg  orally daily for 12 weeks. Indication is esosinophilic esophagitis.   Find out how much it costs.

## 2019-01-19 NOTE — Telephone Encounter (Signed)
Noted. Thanks.

## 2019-01-19 NOTE — Telephone Encounter (Signed)
Pt was returning a call from AM. Please call her back at [phone]

## 2019-03-02 ENCOUNTER — Ambulatory Visit: Payer: 59 | Admitting: Internal Medicine

## 2019-03-15 ENCOUNTER — Telehealth: Payer: Self-pay

## 2019-03-15 NOTE — Telephone Encounter (Signed)
Received a PA from [email or web address] for Dexilant 60 mg. Pt's insurance changed to [facility] [employer], ID [id number], GP [id number]. Waiting on an approval or denial.

## 2019-03-15 NOTE — Telephone Encounter (Signed)
PA was approved through [email or web address]. Pt is aware of approval. Once approval letter is received, it will be scanned in pts chart.

## 2019-04-12 DIAGNOSIS — Z1231 Encounter for screening mammogram for malignant neoplasm of breast: Secondary | ICD-10-CM | POA: Diagnosis not present

## 2019-04-20 DIAGNOSIS — I781 Nevus, non-neoplastic: Secondary | ICD-10-CM | POA: Diagnosis not present

## 2019-04-20 DIAGNOSIS — Z1283 Encounter for screening for malignant neoplasm of skin: Secondary | ICD-10-CM | POA: Diagnosis not present

## 2019-04-20 DIAGNOSIS — D1801 Hemangioma of skin and subcutaneous tissue: Secondary | ICD-10-CM | POA: Diagnosis not present

## 2019-04-20 DIAGNOSIS — L309 Dermatitis, unspecified: Secondary | ICD-10-CM | POA: Diagnosis not present

## 2019-04-20 DIAGNOSIS — L821 Other seborrheic keratosis: Secondary | ICD-10-CM | POA: Diagnosis not present

## 2019-04-20 DIAGNOSIS — L820 Inflamed seborrheic keratosis: Secondary | ICD-10-CM | POA: Diagnosis not present

## 2019-04-20 DIAGNOSIS — L739 Follicular disorder, unspecified: Secondary | ICD-10-CM | POA: Diagnosis not present

## 2019-04-27 ENCOUNTER — Ambulatory Visit: Payer: Self-pay | Admitting: Internal Medicine

## 2019-05-05 DIAGNOSIS — Z01419 Encounter for gynecological examination (general) (routine) without abnormal findings: Secondary | ICD-10-CM | POA: Diagnosis not present

## 2019-05-05 DIAGNOSIS — Z13220 Encounter for screening for lipoid disorders: Secondary | ICD-10-CM | POA: Diagnosis not present

## 2019-05-05 DIAGNOSIS — Z6823 Body mass index (BMI) 23.0-23.9, adult: Secondary | ICD-10-CM | POA: Diagnosis not present

## 2019-05-05 DIAGNOSIS — Z130 Encounter for screening for diseases of the blood and blood-forming organs and certain disorders involving the immune mechanism: Secondary | ICD-10-CM | POA: Diagnosis not present

## 2019-07-09 ENCOUNTER — Ambulatory Visit: Payer: Self-pay | Admitting: Internal Medicine

## 2019-10-07 DIAGNOSIS — Z880 Allergy status to penicillin: Secondary | ICD-10-CM | POA: Diagnosis not present

## 2019-10-07 DIAGNOSIS — E860 Dehydration: Secondary | ICD-10-CM | POA: Diagnosis not present

## 2019-10-07 DIAGNOSIS — K219 Gastro-esophageal reflux disease without esophagitis: Secondary | ICD-10-CM | POA: Diagnosis not present

## 2019-10-07 DIAGNOSIS — Z91030 Bee allergy status: Secondary | ICD-10-CM | POA: Diagnosis not present

## 2019-10-07 DIAGNOSIS — R519 Headache, unspecified: Secondary | ICD-10-CM | POA: Diagnosis not present

## 2019-10-07 DIAGNOSIS — R112 Nausea with vomiting, unspecified: Secondary | ICD-10-CM | POA: Diagnosis not present

## 2019-10-07 DIAGNOSIS — Z79899 Other long term (current) drug therapy: Secondary | ICD-10-CM | POA: Diagnosis not present

## 2019-10-11 ENCOUNTER — Emergency Department (HOSPITAL_COMMUNITY): Payer: BC Managed Care – PPO

## 2019-10-11 ENCOUNTER — Emergency Department (HOSPITAL_COMMUNITY)
Admission: EM | Admit: 2019-10-11 | Discharge: 2019-10-11 | Disposition: A | Discharge status: Home / Self Care | Payer: BC Managed Care – PPO | Attending: Emergency Medicine | Admitting: Emergency Medicine

## 2019-10-11 ENCOUNTER — Other Ambulatory Visit: Payer: Self-pay

## 2019-10-11 ENCOUNTER — Encounter (HOSPITAL_COMMUNITY): Payer: Self-pay

## 2019-10-11 DIAGNOSIS — Z79899 Other long term (current) drug therapy: Secondary | ICD-10-CM | POA: Insufficient documentation

## 2019-10-11 DIAGNOSIS — R197 Diarrhea, unspecified: Secondary | ICD-10-CM | POA: Diagnosis not present

## 2019-10-11 DIAGNOSIS — R7989 Other specified abnormal findings of blood chemistry: Secondary | ICD-10-CM | POA: Diagnosis not present

## 2019-10-11 DIAGNOSIS — R1013 Epigastric pain: Secondary | ICD-10-CM | POA: Insufficient documentation

## 2019-10-11 DIAGNOSIS — U071 COVID-19: Secondary | ICD-10-CM | POA: Diagnosis not present

## 2019-10-11 DIAGNOSIS — Z7982 Long term (current) use of aspirin: Secondary | ICD-10-CM | POA: Insufficient documentation

## 2019-10-11 DIAGNOSIS — R319 Hematuria, unspecified: Secondary | ICD-10-CM | POA: Diagnosis not present

## 2019-10-11 DIAGNOSIS — R109 Unspecified abdominal pain: Secondary | ICD-10-CM | POA: Diagnosis not present

## 2019-10-11 LAB — COMPREHENSIVE METABOLIC PANEL
ALT: 30 U/L (ref 0–44)
AST: 22 U/L (ref 15–41)
Albumin: 4.1 g/dL (ref 3.5–5.0)
Alkaline Phosphatase: 49 U/L (ref 38–126)
Anion gap: 10 (ref 5–15)
BUN: 11 mg/dL (ref 6–20)
CO2: 25 mmol/L (ref 22–32)
Calcium: 9 mg/dL (ref 8.9–10.3)
Chloride: 105 mmol/L (ref 98–111)
Creatinine, Ser: 0.78 mg/dL (ref 0.44–1.00)
GFR calc Af Amer: >60 mL/min (ref >60)
GFR calc non Af Amer: >60 mL/min (ref >60)
Glucose, Bld: 98 mg/dL (ref 70–99)
Potassium: 3.7 mmol/L (ref 3.5–5.1)
Sodium: 140 mmol/L (ref 135–145)
Total Bilirubin: 0.6 mg/dL (ref 0.3–1.2)
Total Protein: 6.9 g/dL (ref 6.5–8.1)

## 2019-10-11 LAB — URINALYSIS, ROUTINE W REFLEX MICROSCOPIC
Bacteria, UA: NONE SEEN
Bilirubin Urine: NEGATIVE
Glucose, UA: NEGATIVE mg/dL
Ketones, ur: NEGATIVE mg/dL
Nitrite: NEGATIVE
Protein, ur: NEGATIVE mg/dL
Specific Gravity, Urine: 1.006 (ref 1.005–1.030)
pH: 7 (ref 5.0–8.0)

## 2019-10-11 LAB — CBC WITH DIFFERENTIAL/PLATELET
Abs Immature Granulocytes: 0.02 10*3/uL (ref 0.00–0.07)
Basophils Absolute: 0 10*3/uL (ref 0.0–0.1)
Basophils Relative: 0 %
Eosinophils Absolute: 0.1 10*3/uL (ref 0.0–0.5)
Eosinophils Relative: 1 %
HCT: 40.4 % (ref 36.0–46.0)
Hemoglobin: 13.6 g/dL (ref 12.0–15.0)
Immature Granulocytes: 0 %
Lymphocytes Relative: 25 %
Lymphs Abs: 1.2 10*3/uL (ref 0.7–4.0)
MCH: 29.6 pg (ref 26.0–34.0)
MCHC: 33.7 g/dL (ref 30.0–36.0)
MCV: 87.8 fL (ref 80.0–100.0)
Monocytes Absolute: 0.2 10*3/uL (ref 0.1–1.0)
Monocytes Relative: 4 %
Neutro Abs: 3.5 10*3/uL (ref 1.7–7.7)
Neutrophils Relative %: 70 %
Platelets: 133 10*3/uL — ABNORMAL LOW (ref 150–400)
RBC: 4.6 MIL/uL (ref 3.87–5.11)
RDW: 12 % (ref 11.5–15.5)
WBC: 5 10*3/uL (ref 4.0–10.5)
nRBC: 0 % (ref 0.0–0.2)

## 2019-10-11 LAB — LIPASE, BLOOD: Lipase: 32 U/L (ref 11–51)

## 2019-10-11 LAB — D-DIMER, QUANTITATIVE: D-Dimer, Quant: 2.14 ug/mL-FEU — ABNORMAL HIGH (ref 0.00–0.50)

## 2019-10-11 LAB — HCG, SERUM, QUALITATIVE: Preg, Serum: NEGATIVE

## 2019-10-11 MED ORDER — MORPHINE SULFATE (PF) 4 MG/ML IV SOLN
4.0000 mg | Freq: Once | INTRAVENOUS | Status: AC
  Administered 2019-10-11: 4 mg via INTRAVENOUS
  Filled 2019-10-11: qty 1

## 2019-10-11 MED ORDER — SODIUM CHLORIDE 0.9 % IV BOLUS
500.0000 mL | Freq: Once | INTRAVENOUS | Status: AC
  Administered 2019-10-11: 500 mL via INTRAVENOUS

## 2019-10-11 MED ORDER — FENTANYL CITRATE (PF) 100 MCG/2ML IJ SOLN
50.0000 ug | Freq: Once | INTRAMUSCULAR | Status: AC
  Administered 2019-10-11: 50 ug via INTRAVENOUS
  Filled 2019-10-11: qty 2

## 2019-10-11 MED ORDER — IOHEXOL 350 MG/ML SOLN: 100.0000 mL | Freq: Once | INTRAVENOUS | Status: DC | PRN

## 2019-10-11 MED ORDER — IOHEXOL 350 MG/ML SOLN
100.0000 mL | Freq: Once | INTRAVENOUS | Status: AC | PRN
  Administered 2019-10-11: 100 mL via INTRAVENOUS

## 2019-10-11 NOTE — ED Notes (Addendum)
Patient resting in bed at this time. Patient awaiting ride for d/c. Ride unable to get here until 1800 due patient's ride being at the dentist with children. Will continue to monitor.

## 2019-10-11 NOTE — ED Notes (Signed)
Rounded on patient at this time. No needs per patient. Will continue to monitor. Call bell at patient's side.

## 2019-10-11 NOTE — ED Notes (Signed)
Patient returned from CT at this time.

## 2019-10-11 NOTE — ED Notes (Signed)
BP trended low. Patient repositioned and blood pressure cuff repositioned. BP improved will continue to monitor. PA Soto notified.

## 2019-10-11 NOTE — ED Triage Notes (Addendum)
Patient presents to the ED with complaints of abd pain. Patient reports she was tested and positive for COVID 2 weeks ago. Patient has been seen by [facility] for complaints of headache on Thursday but today having abd pain with no headaches. Reports burning with urination and blood in urine. Denies nausea and vomiting. Denies fevers today. States she has a hx of EOE. NAD noted.

## 2019-10-11 NOTE — ED Provider Notes (Signed)
[facility] EMERGENCY DEPARTMENT Provider Note   CSN: [id number] Arrival date & time: 10/11/19  5852     History   Chief Complaint Chief Complaint  Patient presents with   Abdominal Pain    HPI Kayla White is a 48 y.o. female.     48 y.o female with a PMH of Anxiety, Depression,EOE, esophageal dysphagia presents to the ED with a chief complaint of abdominal pain.  Patient positive for COVID-19 infection about 2 weeks ago.  She had been treating her symptoms at home however, was seen at [facility] ED on Thursday, reports she was given multiple liters of fluid, states her symptoms have now worsened.  She endorsed epigastric pain with radiation onto the right left and her back region.  Reports of feelings of something stuck on her esophagus like in the past this high from 10 times worse.  He was discharged with nausea medication along with antibiotics which she has not taken.  She also endorses some grossly hematuria, reports her last menstrual cycle was several days ago and this was not related.  She is having some difficulty urinating, no dysuria.  She has had multiple episodes of diarrhea without any blood.  She feels this pain along the epigastric region is masturbated with deep inspiration as it hurts to take a deep breath.  She has been taking steroids, heating pad, aspirin, zinc, B12 complex with mild relief.  She reports a members of her family tested for Covid including mother who is currently hospitalized at [facility].  Does have multiple episodes where she had her esophagus dilated, prior history of esophageal spasms.  He denies any fever, vomiting, prior history of blood clots.      Abdominal Pain Associated symptoms: chills, diarrhea, hematuria, nausea and shortness of breath   Associated symptoms: no chest pain, no dysuria, no fever, no sore throat and no vomiting     Past Medical History:  Diagnosis Date   Anxiety    Depression    GERD (gastroesophageal reflux  disease)    IBS (irritable bowel syndrome)    Migraines     Patient Active Problem List   Diagnosis Date Noted   Flexural atopic dermatitis 11/13/2018   Seasonal allergic rhinitis 11/13/2018   Insect sting allergy 11/13/2018   Eosinophilic esophagitis 11/13/2018   RUQ pain 10/13/2018   GERD (gastroesophageal reflux disease) 10/13/2018   Esophageal dysphagia 10/13/2018   Biliary sludge    Rectal bleeding 07/09/2018   Dyspepsia 07/09/2018   Cervical strain 06/04/2016   Cervicogenic headache 06/04/2016    Past Surgical History:  Procedure Laterality Date   BIOPSY  10/19/2018   Procedure: BIOPSY;  Surgeon: Corbin Ade, MD;  Location: [facility] ENDO SUITE;  Service: Endoscopy;;  gastric   CHOLECYSTECTOMY N/A 08/14/2018   Procedure: LAPAROSCOPIC CHOLECYSTECTOMY;  Surgeon: Franky Macho, MD;  Location: [facility] ORS;  Service: General;  Laterality: N/A;   COLONOSCOPY WITH ESOPHAGOGASTRODUODENOSCOPY (EGD)  06/2018   Dr. Gabriel Cirri: EGD "normal".  Colonoscopy with hemorrhoids.   DILATION AND CURETTAGE OF UTERUS  2009   ESOPHAGOGASTRODUODENOSCOPY (EGD) WITH PROPOFOL N/A 10/19/2018   Dr. Jena Gauss: Mild Schatzki ring found at [facility] with tiny erosions.  Mild longitudinal furrows of the tubular esophagus.  Small hiatal hernia.  Multiple erosions in the stomach.  Diffuse erythematous mucosa found in the entire stomach.  Esophagus dilated up to 54 [place].  Gastric biopsy mild reactive gastropathy, mild chronic gastritis. BX esophagitis with up to 30/HPF intraepithelial EOS   IVF  2007, 099  amd 2010   LAPAROSCOPIC ABDOMINAL EXPLORATION     R/o endometriosis   MALONEY DILATION N/A 10/19/2018   Procedure: Elease HashimotoMALONEY DILATION;  Surgeon: Corbin Adeourk, Robert M, MD;  Location: [facility] ENDO SUITE;  Service: Endoscopy;  Laterality: N/A;     OB History   No obstetric history on file.      Home Medications    Prior to Admission medications   Medication Sig Start Date End Date Taking? Authorizing Provider    acetaminophen (TYLENOL) 500 MG tablet Take 500-1,000 mg by mouth every 6 (six) hours as needed (for pain.).    Yes [provider]  aspirin 325 MG EC tablet Take 325 mg by mouth 2 (two) times daily. 1 po 8am, 1 po 8pm.   Yes [provider]  budesonide (RHINOCORT AQUA) 32 MCG/ACT nasal spray Place into both nostrils daily.   Yes [provider]  famotidine (PEPCID) 20 MG tablet Take 20 mg by mouth 2 (two) times daily.   Yes [provider]  fluticasone (FLOVENT HFA) 220 MCG/ACT inhaler Two sprays into the mouth and swallow twice daily. Do not eat or drink for 30 minutes afterwards. 12/25/18  Yes Tiffany KocherLewis, Leslie S, PA-C  halobetasol (ULTRAVATE) 0.05 % cream Apply 1 application topically 2 (two) times daily as needed (for ezcema).   Yes [provider]  montelukast (SINGULAIR) 10 MG tablet Take 1 tablet by mouth daily. 12/24/18  Yes [provider]  Multiple Vitamin (MULTIVITAMIN WITH MINERALS) TABS tablet Take 1 tablet by mouth daily.   Yes [provider]  predniSONE (DELTASONE) 50 MG tablet Take 50 mg by mouth daily. 10/06/19  Yes [provider]  sertraline (ZOLOFT) 100 MG tablet Take 100 mg by mouth at bedtime.  04/16/16  Yes [provider]  thiamine (VITAMIN B-1) 100 MG tablet Take 100 mg by mouth daily.   Yes [provider]  zinc sulfate 220 (50 Zn) MG capsule Take 220 mg by mouth daily.   Yes [provider]  dexlansoprazole (DEXILANT) 60 MG capsule Take 1 capsule (60 mg total) by mouth daily. Patient not taking: Reported on 10/11/2019 10/27/18   Rourk, Gerrit Friendsobert M, MD    Family History Family History  Problem Relation Age of Onset   Congestive Heart Failure Father    Arthritis Mother    Hypertension Mother    Alzheimer's disease Paternal Grandmother    Colon cancer Neg Hx    Colon polyps Neg Hx     Social History Social History   Tobacco Use   Smoking status: Never Smoker    Smokeless tobacco: Never Used  Substance Use Topics   Alcohol use: No    Alcohol/week: 0.0 standard drinks   Drug use: No     Allergies   Bee venom, Shellfish allergy, and Penicillins   Review of Systems Review of Systems  Constitutional: Positive for chills. Negative for fever.  HENT: Negative for sinus pain and sore throat.   Respiratory: Positive for shortness of breath.   Cardiovascular: Negative for chest pain and leg swelling.  Gastrointestinal: Positive for abdominal pain, diarrhea and nausea. Negative for vomiting.  Genitourinary: Positive for difficulty urinating and hematuria. Negative for dysuria and flank pain.  Musculoskeletal: Negative for back pain.  Skin: Negative for pallor and wound.  Neurological: Positive for headaches. Negative for weakness, light-headedness and numbness.     Physical Exam Updated Vital Signs BP (!) 106/56    Pulse 66    Temp 99 F (37.2 C) (  Oral)    Resp 17    Ht  (1.753 m)    Wt 67.1 kg    LMP 09/27/2019    SpO2 100%    BMI 21.86 kg/m   Physical Exam Vitals signs and nursing note reviewed.  Constitutional:      General: She is not in acute distress.    Appearance: She is well-developed. She is ill-appearing.  HENT:     Head: Normocephalic and atraumatic.     Mouth/Throat:     Pharynx: No oropharyngeal exudate.  Eyes:     Pupils: Pupils are equal, round, and reactive to light.  Neck:     Musculoskeletal: Normal range of motion.  Cardiovascular:     Rate and Rhythm: Regular rhythm.     Heart sounds: Normal heart sounds.  Pulmonary:     Effort: Pulmonary effort is normal. No respiratory distress.     Breath sounds: Normal breath sounds.  Abdominal:     General: Bowel sounds are normal. There is no distension.     Palpations: Abdomen is soft.     Tenderness: There is abdominal tenderness in the epigastric area. There is left CVA tenderness. There is no guarding. Negative signs include Murphy's sign and McBurney's sign.    Musculoskeletal:        General: No tenderness or deformity.     Right lower leg: No edema.     Left lower leg: No edema.  Skin:    General: Skin is warm and dry.  Neurological:     Mental Status: She is alert and oriented to person, place, and time.      ED Treatments / Results  Labs (all labs ordered are listed, but only abnormal results are displayed) Labs Reviewed  CBC WITH DIFFERENTIAL/PLATELET - Abnormal; Notable for the following components:      Result Value   Platelets 133 (*)    All other components within normal limits  URINALYSIS, ROUTINE W REFLEX MICROSCOPIC - Abnormal; Notable for the following components:   Color, Urine STRAW (*)    Hgb urine dipstick LARGE (*)    Leukocytes,Ua TRACE (*)    All other components within normal limits  D-DIMER, QUANTITATIVE (NOT AT [facility]) - Abnormal; Notable for the following components:   D-Dimer, Quant 2.14 (*)    All other components within normal limits  COMPREHENSIVE METABOLIC PANEL  LIPASE, BLOOD  HCG, SERUM, QUALITATIVE    EKG None  Radiology Ct Angio Chest Pe W And/or Wo Contrast  Result Date: 10/11/2019 CLINICAL DATA:  48 year old female with abdominal pain. Positive COVID-19. Elevated D-dimer. EXAM: CT ANGIOGRAPHY CHEST WITH CONTRAST TECHNIQUE: Multidetector CT imaging of the chest was performed using the standard protocol during bolus administration of intravenous contrast. Multiplanar CT image reconstructions and MIPs were obtained to evaluate the vascular anatomy. CONTRAST:  OMNIPAQUE IOHEXOL 350 MG/ML SOLN COMPARISON:  Chest radiograph dated 10/11/2019. FINDINGS: Cardiovascular: There is no cardiomegaly or pericardial effusion. The thoracic aorta is unremarkable. The visualized origins of the great vessels of the aortic arch branches appear unremarkable. Evaluation of the pulmonary arteries is somewhat limited due to suboptimal opacification of peripheral branches. No pulmonary artery embolus identified.  Mediastinum/Nodes: No hilar or mediastinal adenopathy. The esophagus and thyroid gland are grossly unremarkable. No mediastinal fluid collection. Lungs/Pleura: Minimal bibasilar dependent atelectatic changes. There is no focal consolidation, pleural effusion, or pneumothorax. The central airways are patent. Upper Abdomen: Cholecystectomy. Musculoskeletal: No chest wall abnormality. No acute or significant osseous findings. Two  Review of the MIP images confirms the above findings. IMPRESSION: No acute intrathoracic pathology. No CT evidence of pulmonary embolism. Electronically Signed   By: Elgie Collard M.D.   On: 10/11/2019 16:24   Dg Chest Portable 1 View  Result Date: 10/11/2019 CLINICAL DATA:  Abdominal pain, history of recent positive COVID test EXAM: PORTABLE CHEST 1 VIEW COMPARISON:  None. FINDINGS: The heart size and mediastinal contours are within normal limits. Both lungs are clear, noting that the extreme lung apices are excluded. The visualized skeletal structures are unremarkable. Cholecystectomy clips. IMPRESSION: No acute process in the chest. Electronically Signed   By: Guadlupe Spanish M.D.   On: 10/11/2019 10:42   Ct Renal Stone Study  Result Date: 10/11/2019 CLINICAL DATA:  Epigastric region pain and hematuria EXAM: CT ABDOMEN AND PELVIS WITHOUT CONTRAST TECHNIQUE: Multidetector CT imaging of the abdomen and pelvis was performed following the standard protocol without oral or IV contrast. COMPARISON:  None. FINDINGS: Lower chest: There is bibasilar atelectasis. No edema or consolidation. Hepatobiliary: No focal liver lesions are evident. The gallbladder is absent. There is no appreciable biliary duct dilatation. Pancreas: There is pancreatic mass or inflammatory focus. Spleen: No splenic lesions are evident. Adrenals/Urinary Tract: Adrenals bilaterally appear normal. Kidneys bilaterally show no evident mass or hydronephrosis on either side. There is no intrarenal calculus on either  side. There are no appreciable ureteral calculi. Note that there are phleboliths which are adjacent to each distal ureter but appear separate from the distal ureters. Urinary bladder wall thickness is within normal limits. Stomach/Bowel: Rectum is mildly distended with stool. There is no appreciable bowel wall or mesenteric thickening. Terminal ileum appears normal. There is no evident bowel obstruction. There is no free air or portal venous air. Vascular/Lymphatic: There is no abdominal aortic aneurysm. No vascular lesions are evident on this noncontrast enhanced study. There is no appreciable adenopathy in the abdomen or pelvis. Reproductive: Uterus is retroverted. No pelvic mass evident. There is fluid tracking from the right adnexa into the rightward aspect of the cul-de-sac. Other: Appendix appears normal. There is no evident abscess in the abdomen or pelvis. There is no ascites beyond the fluid tracking from the right adnexa to the rightward aspect of the cul-de-sac. Musculoskeletal: There are pars defects at L5 bilaterally with slight spondylolisthesis at L5-S1. No blastic or lytic bone lesions. No intramuscular or abdominal wall lesions are evident. IMPRESSION: 1. Fluid tracks from the right adnexal region into the rightward aspect of the cul-de-sac. Suspect recent ovarian cyst rupture. 2. No evident renal or ureteral calculus. No hydronephrosis. Urinary bladder wall thickness is normal. A cause for hematuria has not been established this study. 3. No bowel obstruction. No abscess in the abdomen or pelvis. Appendix appears normal. 4.  Gallbladder absent. 5.  Pars defects at L5 bilaterally. Electronically Signed   By: Bretta Bang III M.D.   On: 10/11/2019 13:08    Procedures Procedures (including critical care time)  Medications Ordered in ED Medications  fentaNYL (SUBLIMAZE) injection 50 mcg (has no administration in time range)  morphine 4 MG/ML injection 4 mg (4 mg Intravenous Given 10/11/19  1120)  sodium chloride 0.9 % bolus 500 mL (0 mLs Intravenous Stopped 10/11/19 1601)  iohexol (OMNIPAQUE) 350 MG/ML injection 100 mL (100 mLs Intravenous Contrast Given 10/11/19 1601)     Initial Impression / Assessment and Plan / ED Course  I have reviewed the triage vital signs and the nursing notes.  Pertinent labs & imaging results that were  available during my care of the patient were reviewed by me and considered in my medical decision making (see chart for details).    Patient with a past medical history of EOE, COVID-19 infection, suicidal dilation presents to the ED with complaints of epigastric pain, this began 4 days ago after being seen in eating.  She was on fluids, antibiotics, nausea medication but reports she did not take any of this.  Today she arrived with a headache, nausea, diarrhea, severe epigastric pain, she reports this feels like a sensation of having an object stuck in her esophagus although 10 times worse.  He has been applying heating pad to it with some relieving symptoms, reports she has been taking supplemental medication to help with her Covid symptoms such as zinc, aspirin, B12 complex.   She has not had any fevers, vomiting episodes, vitals are within normal limits, she is afebrile on today's visit.  According to patient she did have a CT scan done at [facility], I am unable to track these records at this time however states her lungs were clear and according to the CT.   BMP without any electrolyte abnormality.  Lipase level is within normal limits.  Her hCG is negative.  CBC without any leukocytosis, no signs of anemia.  UA with a trace of leukocytes, large hemoglobin, some suspicion for infected stone, versus [place].  Will obtain a CT renal to further.  She was given 4 mg of IV morphine to help with her symptoms.  Vitals remained stable.  11:24 AM cells have been discussed with patient, she quires about blood clots.  Explained to her that she does not currently have  risk factors such as recent immobilization, surgery, smoking, tachycardia, hypoxia.  Explained D-dimer level to patient as this will likely be positive if she is still having active COVID-19 infection.  Will now follow-up with CT renal.  Vital signs are stable without hypoxia or tachycardia.  Denies any history of estrogen.   CT renal showed: 1. Fluid tracks from the right adnexal region into the rightward  aspect of the cul-de-sac. Suspect recent ovarian cyst rupture.    2. No evident renal or ureteral calculus. No hydronephrosis. Urinary  bladder wall thickness is normal. A cause for hematuria has not been  established this study.    3. No bowel obstruction. No abscess in the abdomen or pelvis.  Appendix appears normal.    4. Gallbladder absent.    5. Pars defects at L5 bilaterally.     These results were explained to patient at length, she shows concern for a clot, discussed but she currently does not fit criteria for this, however will obtain a D-dimer level as patient is requesting this at this time.  Her pain has been controlled with 4 mg of morphine, vitals remained stable.  3:33 PM D-dimer level has resulted positive, a CTA of her chest has been ordered to rule out any PE.  Patient agrees with exam at this time.  Blood pressures have been on the softer side, remained stable, was given 500 ml of fluids to help with her hypotension.  CT Angio showed: No acute intrathoracic pathology. No CT evidence of pulmonary  embolism.   These results were discussed with patient at length.  All her questions were answered.  She was given a copy of her CT angio chest to take her to primary care physician.  Patient with soft pressures, although stable for discharge without any dizziness, chest pain, shortness of breath.  Portions of this note were generated with [employer]. Dictation errors may occur despite best attempts at proofreading.  Final Clinical Impressions(s) /  ED Diagnoses   Final diagnoses:  Epigastric pain  Positive D dimer    ED Discharge Orders    None       Janeece Fitting, Hershal Coria 10/11/19 1632    Noemi Chapel, MD 10/14/19 [phone]

## 2019-10-11 NOTE — ED Notes (Signed)
Patient transported to CT 

## 2019-10-11 NOTE — ED Notes (Signed)
Rounded on patient at this time. No needs. Will continue to monitor.

## 2019-10-11 NOTE — ED Notes (Addendum)
Beta HCG added on verbal order by PA Irene Pap

## 2019-10-11 NOTE — Discharge Instructions (Addendum)
Your laboratory results were within normal limits today.  A copy of your CT renal was provided to you.  Please follow up with your primary care physician as needed.

## 2019-10-11 NOTE — ED Notes (Signed)
PA at the bedside a this time

## 2019-11-16 DIAGNOSIS — K8689 Other specified diseases of pancreas: Secondary | ICD-10-CM | POA: Diagnosis not present

## 2020-09-05 ENCOUNTER — Other Ambulatory Visit (HOSPITAL_COMMUNITY): Payer: Self-pay | Admitting: Obstetrics & Gynecology

## 2020-09-05 DIAGNOSIS — Z1231 Encounter for screening mammogram for malignant neoplasm of breast: Secondary | ICD-10-CM

## 2020-10-09 ENCOUNTER — Ambulatory Visit (INDEPENDENT_AMBULATORY_CARE_PROVIDER_SITE_OTHER): Payer: Self-pay | Admitting: Obstetrics & Gynecology

## 2020-10-09 ENCOUNTER — Encounter: Payer: Self-pay | Admitting: Obstetrics & Gynecology

## 2020-10-09 ENCOUNTER — Other Ambulatory Visit: Payer: Self-pay

## 2020-10-09 ENCOUNTER — Other Ambulatory Visit (HOSPITAL_COMMUNITY)
Admission: RE | Admit: 2020-10-09 | Discharge: 2020-10-09 | Disposition: A | Discharge status: Home / Self Care | Payer: 59 | Source: Ambulatory Visit | Attending: Obstetrics & Gynecology | Admitting: Obstetrics & Gynecology

## 2020-10-09 VITALS — BP 112/62 | HR 73 | Ht 69.0 in | Wt 165.0 lb

## 2020-10-09 DIAGNOSIS — Z01419 Encounter for gynecological examination (general) (routine) without abnormal findings: Secondary | ICD-10-CM | POA: Diagnosis present

## 2020-10-09 NOTE — Progress Notes (Signed)
Subjective:     Kayla White is a 49 y.o. female here for a routine exam.  Patient's last menstrual period was 10/06/2020. [id number] Birth Control Method:  vasectomy Menstrual Calendar(currently): regular  Current complaints: none.   Current acute medical issues:  eosiniphilic esophagitis   Recent Gynecologic History Patient's last menstrual period was 10/06/2020. Last Pap: 2018,  normal Last mammogram: 11/02/20,  scheduled  Past Medical History:  Diagnosis Date  . Anxiety   . Depression   . Eosinophilic esophagitis   . GERD (gastroesophageal reflux disease)   . IBS (irritable bowel syndrome)   . Migraines     Past Surgical History:  Procedure Laterality Date  . BIOPSY  10/19/2018   Procedure: BIOPSY;  Surgeon: Corbin Ade, MD;  [facility];  Service: Endoscopy;;  gastric  . CHOLECYSTECTOMY N/A 08/14/2018   Procedure: LAPAROSCOPIC CHOLECYSTECTOMY;  Surgeon: Franky Macho, MD;  Location: [facility];  Service: General;  Laterality: N/A;  . COLONOSCOPY WITH ESOPHAGOGASTRODUODENOSCOPY (EGD)  06/2018   Dr. Gabriel Cirri: EGD "normal".  Colonoscopy with hemorrhoids.  Marland Kitchen DILATION AND CURETTAGE OF UTERUS  2009  . ESOPHAGOGASTRODUODENOSCOPY (EGD) WITH PROPOFOL N/A 10/19/2018   Dr. Jena Gauss: Mild Schatzki ring found at [facility] with tiny erosions.  Mild longitudinal furrows of the tubular esophagus.  Small hiatal hernia.  Multiple erosions in the stomach.  Diffuse erythematous mucosa found in the entire stomach.  Esophagus dilated up to 54 [place].  Gastric biopsy mild reactive gastropathy, mild chronic gastritis. BX esophagitis with up to 30/HPF intraepithelial EOS  . IVF  2007, 099 amd 2010  . LAPAROSCOPIC ABDOMINAL EXPLORATION     R/o endometriosis  . MALONEY DILATION N/A 10/19/2018   Procedure: Elease Hashimoto DILATION;  Surgeon: Corbin Ade, MD;  [facility]: [facility];  Service: Endoscopy;  Laterality: N/A;    OB History    Gravida  3   Para  2   Term      Preterm  2    AB  1   Living  4     SAB  1   TAB      Ectopic      Multiple  2   Live Births  4           Social History   Socioeconomic History  . Marital status: Married    Spouse name: Not on file  . Number of children: 4  . Years of education: 53  . Highest education level: Not on file  Occupational History  . Occupation: [employer] account manager  Tobacco Use  . Smoking status: Never Smoker  . Smokeless tobacco: Never Used  Vaping Use  . Vaping Use: Never used  Substance and Sexual Activity  . Alcohol use: No    Alcohol/week: 0.0 standard drinks  . Drug use: No  . Sexual activity: Yes    Birth control/protection: None    Comment: husband had vasectomy  Other Topics Concern  . Not on file  Social History Narrative   Lives at home w/ her husband and 4 children   Right-handed   Rare caffeine   Social Determinants of Health   Financial Resource Strain:   . Difficulty of Paying Living Expenses: Not on file  Food Insecurity:   . Worried About Programme researcher, broadcasting/film/video in the Last Year: Not on file  . Ran Out of Food in the Last Year: Not on file  Transportation Needs:   . Lack of Transportation (Medical): Not on file  .  Lack of Transportation (Non-Medical): Not on file  Physical Activity:   . Days of Exercise per Week: Not on file  . Minutes of Exercise per Session: Not on file  Stress:   . Feeling of Stress : Not on file  Social Connections:   . Frequency of Communication with Friends and Family: Not on file  . Frequency of Social Gatherings with Friends and Family: Not on file  . Attends Religious Services: Not on file  . Active Member of Clubs or Organizations: Not on file  . Attends Banker Meetings: Not on file  . Marital Status: Not on file    Family History  Problem Relation Age of Onset  . Congestive Heart Failure Father   . Dementia Father   . Thyroid disease Father   . Arthritis Mother   . Hypertension Mother   . Alzheimer's disease Paternal  Grandmother   . Cancer Paternal Grandfather   . Cancer Maternal Grandfather   . Anxiety disorder Son   . Depression Son   . OCD Son   . Autism Son   . Hearing loss Son        has hearing aid  . Colon cancer Neg Hx   . Colon polyps Neg Hx      Current Outpatient Medications:  .  acetaminophen (TYLENOL) 500 MG tablet, Take 500-1,000 mg by mouth every 6 (six) hours as needed (for pain.). , Disp: , Rfl:  .  famotidine (PEPCID) 20 MG tablet, Take 20 mg by mouth as needed. , Disp: , Rfl:  .  fluticasone (FLOVENT HFA) 220 MCG/ACT inhaler, Two sprays into the mouth and swallow twice daily. Do not eat or drink for 30 minutes afterwards., Disp: 1 Inhaler, Rfl: 1 .  halobetasol (ULTRAVATE) 0.05 % cream, Apply 1 application topically 2 (two) times daily as needed (for ezcema)., Disp: , Rfl:  .  Multiple Vitamin (MULTIVITAMIN WITH MINERALS) TABS tablet, Take 1 tablet by mouth daily., Disp: , Rfl:  .  omeprazole (PRILOSEC) 20 MG capsule, Take 20 mg by mouth daily., Disp: , Rfl:  .  sertraline (ZOLOFT) 100 MG tablet, Take 200 mg by mouth at bedtime. , Disp: , Rfl:  .  solifenacin (VESICARE) 5 MG tablet, Take 5 mg by mouth as needed., Disp: , Rfl:   Review of Systems  Review of Systems  Constitutional: Negative for fever, chills, weight loss, malaise/fatigue and diaphoresis.  HENT: Negative for hearing loss, ear pain, nosebleeds, congestion, sore throat, neck pain, tinnitus and ear discharge.   Eyes: Negative for blurred vision, double vision, photophobia, pain, discharge and redness.  Respiratory: Negative for cough, hemoptysis, sputum production, shortness of breath, wheezing and stridor.   Cardiovascular: Negative for chest pain, palpitations, orthopnea, claudication, leg swelling and PND.  Gastrointestinal: negative for abdominal pain. Negative for heartburn, nausea, vomiting, diarrhea, constipation, blood in stool and melena.  Genitourinary: Negative for dysuria, urgency, frequency, hematuria  and flank pain.  Musculoskeletal: Negative for myalgias, back pain, joint pain and falls.  Skin: Negative for itching and rash.  Neurological: Negative for dizziness, tingling, tremors, sensory change, speech change, focal weakness, seizures, loss of consciousness, weakness and headaches.  Endo/Heme/Allergies: Negative for environmental allergies and polydipsia. Does not bruise/bleed easily.  Psychiatric/Behavioral: Negative for depression, suicidal ideas, hallucinations, memory loss and substance abuse. The patient is not nervous/anxious and does not have insomnia.        Objective:  Blood pressure 112/62, pulse 73, height 5\' 9"  (1.753 m), weight 165 lb (  74.8 kg), last menstrual period 10/06/2020.   Physical Exam  Vitals reviewed. Constitutional: She is oriented to person, place, and time. She appears well-developed and well-nourished.  HENT:  Head: Normocephalic and atraumatic.        Right Ear: External ear normal.  Left Ear: External ear normal.  Nose: Nose normal.  Mouth/Throat: Oropharynx is clear and moist.  Eyes: Conjunctivae and EOM are normal. Pupils are equal, round, and reactive to light. Right eye exhibits no discharge. Left eye exhibits no discharge. No scleral icterus.  Neck: Normal range of motion. Neck supple. No tracheal deviation present. No thyromegaly present.  Cardiovascular: Normal rate, regular rhythm, normal heart sounds and intact distal pulses.  Exam reveals no gallop and no friction rub.   No murmur heard. Respiratory: Effort normal and breath sounds normal. No respiratory distress. She has no wheezes. She has no rales. She exhibits no tenderness.  GI: Soft. Bowel sounds are normal. She exhibits no distension and no mass. There is no tenderness. There is no rebound and no guarding.  Genitourinary:  Breasts no masses skin changes or nipple changes bilaterally      Vulva is normal without lesions Vagina is pink moist without discharge Cervix normal in  appearance and pap is done Uterus is normal size shape and contour, sime uterine and bladder relaxation Adnexa is negative with normal sized ovaries   Musculoskeletal: Normal range of motion. She exhibits no edema and no tenderness.  Neurological: She is alert and oriented to person, place, and time. She has normal reflexes. She displays normal reflexes. No cranial nerve deficit. She exhibits normal muscle tone. Coordination normal.  Skin: Skin is warm and dry. No rash noted. No erythema. No pallor.  Psychiatric: She has a normal mood and affect. Her behavior is normal. Judgment and thought content normal.       Medications Ordered at today's visit: No orders of the defined types were placed in this encounter.   Other orders placed at today's visit: No orders of the defined types were placed in this encounter.     Assessment:    Normal Gyn exam.   Grade 2 uterine and bladder prolapse Plan:    Contraception: vasectomy. Mammogram ordered. Follow up in: 3 years.     No follow-ups on file.

## 2020-10-13 LAB — CYTOLOGY - PAP
Comment: NEGATIVE
Diagnosis: NEGATIVE
High risk HPV: NEGATIVE

## 2020-11-02 ENCOUNTER — Other Ambulatory Visit: Payer: Self-pay

## 2020-11-02 ENCOUNTER — Ambulatory Visit (HOSPITAL_COMMUNITY)
Admission: RE | Admit: 2020-11-02 | Discharge: 2020-11-02 | Disposition: A | Discharge status: Home / Self Care | Payer: 59 | Source: Ambulatory Visit | Attending: Obstetrics & Gynecology | Admitting: Obstetrics & Gynecology

## 2020-11-02 DIAGNOSIS — Z1231 Encounter for screening mammogram for malignant neoplasm of breast: Secondary | ICD-10-CM | POA: Insufficient documentation

## 2020-12-29 ENCOUNTER — Telehealth: Payer: Self-pay | Admitting: Internal Medicine

## 2020-12-29 NOTE — Telephone Encounter (Signed)
Pt needs an OV, RMR pt and her last procedure was done with propofol.

## 2020-12-29 NOTE — Telephone Encounter (Signed)
PATIENT CALLED ABOUT SCHEDULING A COLONOSCOPY, DOES SHE NEED AN OV OR NURSE VISIT

## 2021-01-01 ENCOUNTER — Encounter: Payer: Self-pay | Admitting: Gastroenterology

## 2021-01-23 ENCOUNTER — Ambulatory Visit: Payer: 59 | Admitting: Gastroenterology

## 2021-01-23 NOTE — Progress Notes (Deleted)
History of eosinophilic esophagitis, last seen in Jan 2020. Last colonoscopy at outside facility in 2019 by Dr. Gabriel Cirri.

## 2021-03-14 ENCOUNTER — Ambulatory Visit: Payer: 59 | Admitting: Gastroenterology

## 2021-03-21 ENCOUNTER — Other Ambulatory Visit: Payer: Self-pay

## 2021-03-21 ENCOUNTER — Encounter: Payer: Self-pay | Admitting: Gastroenterology

## 2021-03-21 ENCOUNTER — Ambulatory Visit (INDEPENDENT_AMBULATORY_CARE_PROVIDER_SITE_OTHER): Payer: 59 | Admitting: Gastroenterology

## 2021-03-21 VITALS — BP 99/62 | HR 66 | Temp 96.8°F | Ht 67.0 in | Wt 166.0 lb

## 2021-03-21 DIAGNOSIS — R1319 Other dysphagia: Secondary | ICD-10-CM

## 2021-03-21 DIAGNOSIS — K581 Irritable bowel syndrome with constipation: Secondary | ICD-10-CM

## 2021-03-21 DIAGNOSIS — R109 Unspecified abdominal pain: Secondary | ICD-10-CM

## 2021-03-21 DIAGNOSIS — Z1211 Encounter for screening for malignant neoplasm of colon: Secondary | ICD-10-CM

## 2021-03-21 DIAGNOSIS — G8929 Other chronic pain: Secondary | ICD-10-CM

## 2021-03-21 DIAGNOSIS — K200 Eosinophilic esophagitis: Secondary | ICD-10-CM | POA: Diagnosis not present

## 2021-03-21 DIAGNOSIS — K589 Irritable bowel syndrome without diarrhea: Secondary | ICD-10-CM | POA: Insufficient documentation

## 2021-03-21 MED ORDER — FLUTICASONE PROPIONATE HFA 220 MCG/ACT IN AERO: INHALATION_SPRAY | RESPIRATORY_TRACT | 1 refills | Status: DC

## 2021-03-21 MED ORDER — LUBIPROSTONE 24 MCG PO CAPS: 24.0000 ug | ORAL_CAPSULE | Freq: Two times a day (BID) | ORAL | 3 refills | Status: DC

## 2021-03-21 NOTE — Progress Notes (Signed)
Primary Care Physician:  Richardean Chimera, MD  Primary Gastroenterologist:  Roetta Sessions, MD   Chief Complaint  Patient presents with  . Colonoscopy  . Constipation    Using otc probiotic, supplements     HPI:  Kayla White is a 50 y.o. female here for follow up.  Last seen January 2020.  She has a history of Schatzki ring, eosinophilic esophagitis, gastritis.  Food allergy testing was positive to peanut, egg, milk, oat, barley, rye, wheat, tomato, cucumber, ginger, garlic, sesame, coconut.  At time of last office visit she was started on treatment for eosinophilic esophagitis.  Insurance would not cover fluticasone, she was prescribed viscous budesonide 2 mg daily.  She believes she took only 2 weeks and not a full 12 weeks.  Pharmacy can only compound 2 weeks at a time and she may have not realized that she needed refills.     Patient presents today stating she continues to have issues with her esophagus.  She does have some difficulty swallowing at times (solid food and large pills), burning and discomfort in the substernal region.  She typically avoids food allergens but does mention eating peanut butter.  No nausea or vomiting.  She takes over-the-counter omeprazole/sodium bicarb, once daily.  She has chronic IBS-C.  Currently using Juice Plus, Vear Clock' colon health, probiotic.  Once per week she uses glycerin suppository to facilitate good bowel movement for the past one year. She also has chronic pain in the right abdomen, at level of the umbilicus.  Worse with meals.  Does not seem to matter what she eats.  Does not happen every day but approximately 3 times per week.  She believes she has a right inguinal hernia as well.  Notes a popping/bulging area with deep coughing.  Current Outpatient Medications  Medication Sig Dispense Refill  . acetaminophen (TYLENOL) 500 MG tablet Take 500-1,000 mg by mouth every 6 (six) hours as needed (for pain.).     Marland Kitchen Calcium Carbonate Antacid  (ANTACID CALCIUM PO) Take by mouth daily.    . fluticasone (FLOVENT HFA) 220 MCG/ACT inhaler Two sprays into the mouth and swallow twice daily. Do not eat or drink for 30 minutes afterwards. 1 Inhaler 1  . halobetasol (ULTRAVATE) 0.05 % cream Apply 1 application topically 2 (two) times daily as needed (for ezcema).    . Multiple Vitamin (MULTIVITAMIN WITH MINERALS) TABS tablet Take 1 tablet by mouth daily.    . Nutritional Supplements (JUICE PLUS FIBRE PO) Take by mouth daily. Fruit/veg/berries    . Omeprazole-Sodium Bicarbonate (ZEGERID) 20-1100 MG CAPS capsule Take 1 capsule by mouth daily before breakfast.    . Probiotic Product (PHILLIPS COLON HEALTH PO) Take by mouth daily.    . Probiotic Product (PROBIOTIC COLON SUPPORT PO) Take by mouth daily.    . sertraline (ZOLOFT) 100 MG tablet Take 200 mg by mouth at bedtime.      No current facility-administered medications for this visit.    Allergies as of 03/21/2021 - Review Complete 03/21/2021  Allergen Reaction Noted  . Bee venom Anaphylaxis and Swelling 08/10/2018  . Eggs or egg-derived products Anaphylaxis 10/09/2020  . Shellfish allergy Hives 10/16/2018  . Penicillins Rash 06/04/2016    Past Medical History:  Diagnosis Date  . Anxiety   . Depression   . Eosinophilic esophagitis   . GERD (gastroesophageal reflux disease)   . IBS (irritable bowel syndrome)   . Migraines     Past Surgical History:  Procedure Laterality Date  .  BIOPSY  10/19/2018   Procedure: BIOPSY;  Surgeon: Corbin Ade, MD;  [facility];  Service: Endoscopy;;  gastric  . CHOLECYSTECTOMY N/A 08/14/2018   Procedure: LAPAROSCOPIC CHOLECYSTECTOMY;  Surgeon: Franky Macho, MD;  Location: [facility];  Service: General;  Laterality: N/A;  . COLONOSCOPY WITH ESOPHAGOGASTRODUODENOSCOPY (EGD)  06/2018   Dr. Gabriel Cirri: EGD "normal".  Colonoscopy with hemorrhoids.  Marland Kitchen DILATION AND CURETTAGE OF UTERUS  2009  . ESOPHAGOGASTRODUODENOSCOPY (EGD) WITH PROPOFOL N/A  10/19/2018   Dr. Jena Gauss: Mild Schatzki ring found at [facility] with tiny erosions.  Mild longitudinal furrows of the tubular esophagus.  Small hiatal hernia.  Multiple erosions in the stomach.  Diffuse erythematous mucosa found in the entire stomach.  Esophagus dilated up to 54 [place].  Gastric biopsy mild reactive gastropathy, mild chronic gastritis. BX esophagitis with up to 30/HPF intraepithelial EOS  . IVF  2007, 099 amd 2010  . LAPAROSCOPIC ABDOMINAL EXPLORATION     R/o endometriosis  . MALONEY DILATION N/A 10/19/2018   Procedure: Elease Hashimoto DILATION;  Surgeon: Corbin Ade, MD;  [facility];  Service: Endoscopy;  Laterality: N/A;    Family History  Problem Relation Age of Onset  . Congestive Heart Failure Father   . Dementia Father   . Thyroid disease Father   . Arthritis Mother   . Hypertension Mother   . Alzheimer's disease Paternal Grandmother   . Cancer Paternal Grandfather   . Cancer Maternal Grandfather   . Anxiety disorder Son   . Depression Son   . OCD Son   . Autism Son   . Hearing loss Son        has hearing aid  . Colon cancer Neg Hx   . Colon polyps Neg Hx     Social History   Socioeconomic History  . Marital status: Married    Spouse name: Not on file  . Number of children: 4  . Years of education: 20  . Highest education level: Not on file  Occupational History  . Occupation: [employer] account manager  Tobacco Use  . Smoking status: Never Smoker  . Smokeless tobacco: Never Used  Vaping Use  . Vaping Use: Never used  Substance and Sexual Activity  . Alcohol use: No    Alcohol/week: 0.0 standard drinks  . Drug use: No  . Sexual activity: Yes    Birth control/protection: None    Comment: husband had vasectomy  Other Topics Concern  . Not on file  Social History Narrative   Lives at home w/ her husband and 4 children   Right-handed   Rare caffeine   Social Determinants of Health   Financial Resource Strain: Not on file  Food Insecurity: Not on  file  Transportation Needs: Not on file  Physical Activity: Not on file  Stress: Not on file  Social Connections: Not on file  Intimate Partner Violence: Not on file      ROS:  General: Negative for anorexia, weight loss, fever, chills, fatigue, weakness. Eyes: Negative for vision changes.  ENT: Negative for hoarseness, difficulty swallowing , nasal congestion. CV: Negative for chest pain, angina, palpitations, dyspnea on exertion, peripheral edema.  Respiratory: Negative for dyspnea at rest, dyspnea on exertion, cough, sputum, wheezing.  GI: See history of present illness. GU:  Negative for dysuria, hematuria, urinary incontinence, urinary frequency, nocturnal urination.  MS: Negative for joint pain, low back pain.  Derm: Negative for rash or itching.  Neuro: Negative for weakness, abnormal sensation, seizure,  frequent headaches, memory loss, confusion.  Psych: Negative for anxiety, depression, suicidal ideation, hallucinations.  Endo: Negative for unusual weight change.  Heme: Negative for bruising or bleeding. Allergy: Negative for rash or hives.    Physical Examination:  BP 99/62   Pulse 66   Temp (!) 96.8 F (36 C) (Temporal)   Ht 5\' 7"  (1.702 m)   Wt 166 lb (75.3 kg)   LMP 03/06/2021   BMI 26.00 kg/m    General: Well-nourished, well-developed in no acute distress.  Head: Normocephalic, atraumatic.   Eyes: Conjunctiva pink, no icterus. Mouth: masked Neck: Supple without thyromegaly, masses, or lymphadenopathy.  Lungs: Clear to auscultation bilaterally.  Heart: Regular rate and rhythm, no murmurs rubs or gallops.  Abdomen: Bowel sounds are normal, nontender, nondistended, no hepatosplenomegaly or masses. She has some fullness in right mid abdomen just at level of umbilicus or above (no mass), no abdominal bruits, no rebound or guarding.  Did not appreciate hernia. Rectal: deferred Extremities: No lower extremity edema. No clubbing or deformities.  Neuro: Alert and  oriented x 4 , grossly normal neurologically.  Skin: Warm and dry, no rash or jaundice.   Psych: Alert and cooperative, normal mood and affect.    Assessment/plan:  Pleasant 50 year old female with history of Schatzki ring status post dilation, eosinophilic esophagitis, food allergies as outlined, IBS-C presenting for follow-up and consider scheduling screening colonoscopy.  Eosinophilic esophagitis: Patient has frequent burning in the chest, some dysphagia to solid foods and large pills.  Trying to avoid foods that she is allergic to but does mention consuming peanut butter (was on her allergy list).  Suspect symptoms are related to EOE.  We will continue over-the-counter Zegerid daily for now.  We will add fluticasone 220 mcg, spray 2 sprays in the mouth and swallow twice daily.  Do not eat or drink for 30 minutes after medication.  Rinse mouth out after that.  Consider upper endoscopy to evaluate symptoms, consider dilation.  IBS-C: Possible etiology of right-sided postprandial abdominal pain.  Optimize treatment, Amitiza 24 mcg twice daily with food.  If persistent right-sided abdominal pain, may need further evaluation via CT.  Screening colonoscopy: Patient request screening colonoscopy.  She completed colonoscopy in 2019 but she is concerned about the quality of her exam. Her colonoscopy in 2019 was not completed by a gastroenterologist. I discussed with Dr. 2020 who recommends screening colonoscopy at this time.  I have encouraged her to discuss with her insurance regarding willingness to cover for a screening colonoscopy in this setting.    1. Fluticasone: Spray 2 sprays into mouth and swallow twice daily.  Do not eat or drink for 30 minutes after administration.  After 30 minutes, rinse mouth out. 2. Amitiza 24 mcg twice daily with food for IBS-C. 3. Screening colonoscopy and EGD/ED with Dr. Jena Gauss with propofol. ASA II.  I have discussed the risks, alternatives, benefits with regards to  but not limited to the risk of reaction to medication, bleeding, infection, perforation and the patient is agreeable to proceed. Written consent to be obtained.

## 2021-03-21 NOTE — Patient Instructions (Signed)
1. For eosinophilic esophagitis: Start fluticasone.  2 sprays into mouth and swallowed (do not inhale) twice daily.  Do not eat or drink for 30 minutes after administration.  After 30 minutes rinse mouth out and spit to decrease chance of developing thrush. 2. For IBS-constipation: Trial of Amitiza 24 mcg twice daily with food for constipation.  Please let me know if this is not effective and we can switch medications. 3. We will be in touch with regards to approaching colonoscopy and upper endoscopy in the near future.

## 2021-03-23 ENCOUNTER — Telehealth: Payer: Self-pay | Admitting: *Deleted

## 2021-03-23 MED ORDER — LINACLOTIDE 145 MCG PO CAPS: 145.0000 ug | ORAL_CAPSULE | Freq: Every day | ORAL | 3 refills | Status: DC

## 2021-03-23 NOTE — Telephone Encounter (Signed)
LMOVM to call back 

## 2021-03-23 NOTE — Telephone Encounter (Signed)
Please let pt know I can try and send in Linzess daily. I can't see what is on her formulary. We can work on compounded medication for her eosinophilic esophagitis but it will be Monday due to office closing.   Also I will touch base with her Monday to discuss the colonoscopy. Dr. Jena Gauss is in agreement in doing it.

## 2021-03-23 NOTE — Addendum Note (Signed)
Addended by: Tiffany Kocher on: 03/23/2021 11:51 AM   Modules accepted: Orders

## 2021-03-23 NOTE — Telephone Encounter (Signed)
Patient called in stating the flovent was going to cost $340 and amitiza $360 that she would have to pay.

## 2021-03-26 NOTE — Telephone Encounter (Signed)
Correction. Pt is aware that Dr. Jena Gauss does want pt to have a TCS and information was given to pt about what's she needs to discuss with her insurance company when she checks on the TCS.   Spoke with [employer], they asked me to leave compound medication request on machine. They will call back with any questions or concerns. Marland Kitchen

## 2021-03-26 NOTE — Telephone Encounter (Signed)
For IBS-C: see recommendation provided regarding Linzess.   For EOS: Patient reports fluticasone spray is too expensive, we can try compounded viscous budesonide. [employer] did this for [place] last time.  Generally they can only make 2 weeks at a time, previously said it was around $40 every 2 weeks and she would potentially need 10 to 12 weeks of medication.   If she wants to pursue this, please have [facility] make compounded viscous oral budesonide solution, 2 mg twice daily for 12 weeks, drink slowly over 5 to 10 minutes, do not eat or drink for 30 minutes after completion.  Indication is eosinophilic esophagitis.  Please also let pt know that Dr. Jena Gauss does recommend a screening colonoscopy. Please specifically tell the patient "we recommend she double check with her insurance company regarding their willingness to cover a screening colonoscopy now given she had one in 2019. She should mention that there is concern about quality of study and potential missed lesions given her exam was not completed by a gastroenterologist".

## 2021-03-26 NOTE — Telephone Encounter (Signed)
Spoke with pt. Pt would like to try the compounded Budesonide from [facility]. Pt is aware that she may be on the medication for 10-12 weeks. Pt was also notified that she doesn't need a screening TCS per Dr. Jena Gauss and was given instructions of what to say to her insurance company when they call.

## 2021-03-26 NOTE — Telephone Encounter (Signed)
Lmom, waiting on a return call.  

## 2021-03-26 NOTE — Telephone Encounter (Signed)
That should read "does need a screening colonoscopy". When she is ready to schedule, let me know.  Can you call in the compounded viscious oral budesonide solution, 2 mg BID, drink slowly over 5 to 10 minutes, do not eat or drink for 30 minutes after completion.  Indications eosinophilic esophagitis. She will need total of 12 weeks.

## 2021-03-27 NOTE — Telephone Encounter (Signed)
Received a call from pt. Pt spoke with her insurance company and said she was told that an authorization has to come from our office and not the pt. Please advise.

## 2021-03-27 NOTE — Telephone Encounter (Signed)
Spoke with Darlina Rumpf. [employer] (patient's insurance) does not require a Prior Authorization. I will discuss with Reba before scheduling.

## 2021-03-29 ENCOUNTER — Encounter (INDEPENDENT_AMBULATORY_CARE_PROVIDER_SITE_OTHER): Payer: Self-pay

## 2021-03-29 NOTE — Telephone Encounter (Signed)
Please schedule screening colonoscopy and diagnostic EGD/ED with Dr. Jena Gauss. With propofol. ASA II. Dx: screening colonoscopy and for EGD - eosinophilic esophagitis, dysphagia, heartburn.    Spoke to [place] who will reach out to patient with instructions about insurance coverage.

## 2021-04-02 NOTE — Telephone Encounter (Signed)
Called and informed pt of Leslie's recommendation and Reba will be reaching out to her. Will call her to schedule procedure when Dr. Luvenia Starch July schedule is available.

## 2021-04-30 ENCOUNTER — Other Ambulatory Visit: Payer: Self-pay

## 2021-04-30 ENCOUNTER — Telehealth: Payer: Self-pay

## 2021-04-30 DIAGNOSIS — Z1211 Encounter for screening for malignant neoplasm of colon: Secondary | ICD-10-CM

## 2021-04-30 DIAGNOSIS — R1319 Other dysphagia: Secondary | ICD-10-CM

## 2021-04-30 DIAGNOSIS — K200 Eosinophilic esophagitis: Secondary | ICD-10-CM

## 2021-04-30 MED ORDER — PEG 3350-KCL-NA BICARB-NACL 420 G PO SOLR: 4000.0000 mL | ORAL | 0 refills | Status: DC

## 2021-04-30 NOTE — Telephone Encounter (Signed)
Called pt, TCS/EGD w/Propofol ASA 2 w/Dr. Jena Gauss scheduled for 06/11/21 at 2:45pm. Advised her to go [facility] lab for pregnancy test 06/08/21. Letter mailed with procedure instructions. Rx for prep sent to pharmacy. Orders entered.

## 2021-06-08 ENCOUNTER — Other Ambulatory Visit: Payer: Self-pay

## 2021-06-08 ENCOUNTER — Other Ambulatory Visit (HOSPITAL_COMMUNITY)
Admission: RE | Admit: 2021-06-08 | Discharge: 2021-06-08 | Disposition: A | Discharge status: Home / Self Care | Payer: 59 | Source: Ambulatory Visit | Attending: Internal Medicine | Admitting: Internal Medicine

## 2021-06-08 DIAGNOSIS — Z1211 Encounter for screening for malignant neoplasm of colon: Secondary | ICD-10-CM | POA: Insufficient documentation

## 2021-06-08 DIAGNOSIS — R1319 Other dysphagia: Secondary | ICD-10-CM | POA: Diagnosis present

## 2021-06-08 DIAGNOSIS — K200 Eosinophilic esophagitis: Secondary | ICD-10-CM

## 2021-06-08 LAB — PREGNANCY, URINE: Preg Test, Ur: NEGATIVE

## 2021-06-11 ENCOUNTER — Other Ambulatory Visit: Payer: Self-pay

## 2021-06-11 ENCOUNTER — Encounter (HOSPITAL_COMMUNITY): Payer: Self-pay | Admitting: Internal Medicine

## 2021-06-11 ENCOUNTER — Ambulatory Visit (HOSPITAL_COMMUNITY): Payer: 59 | Admitting: Anesthesiology

## 2021-06-11 ENCOUNTER — Ambulatory Visit (HOSPITAL_COMMUNITY)
Admission: RE | Admit: 2021-06-11 | Discharge: 2021-06-11 | Disposition: A | Discharge status: Home / Self Care | Payer: 59 | Attending: Internal Medicine | Admitting: Internal Medicine

## 2021-06-11 ENCOUNTER — Encounter (HOSPITAL_COMMUNITY)
Admission: RE | Disposition: A | Discharge status: Home / Self Care | Payer: Self-pay | Source: Home / Self Care | Attending: Internal Medicine

## 2021-06-11 DIAGNOSIS — K219 Gastro-esophageal reflux disease without esophagitis: Secondary | ICD-10-CM | POA: Insufficient documentation

## 2021-06-11 DIAGNOSIS — K222 Esophageal obstruction: Secondary | ICD-10-CM | POA: Insufficient documentation

## 2021-06-11 DIAGNOSIS — Z1211 Encounter for screening for malignant neoplasm of colon: Secondary | ICD-10-CM

## 2021-06-11 DIAGNOSIS — K589 Irritable bowel syndrome without diarrhea: Secondary | ICD-10-CM | POA: Diagnosis not present

## 2021-06-11 DIAGNOSIS — R1310 Dysphagia, unspecified: Secondary | ICD-10-CM | POA: Insufficient documentation

## 2021-06-11 DIAGNOSIS — K449 Diaphragmatic hernia without obstruction or gangrene: Secondary | ICD-10-CM | POA: Diagnosis not present

## 2021-06-11 HISTORY — PX: ESOPHAGEAL DILATION: SHX303

## 2021-06-11 HISTORY — DX: Other specified postprocedural states: Z98.890

## 2021-06-11 HISTORY — PX: COLONOSCOPY WITH PROPOFOL: SHX5780

## 2021-06-11 HISTORY — PX: ESOPHAGOGASTRODUODENOSCOPY (EGD) WITH PROPOFOL: SHX5813

## 2021-06-11 HISTORY — DX: Nausea with vomiting, unspecified: R11.2

## 2021-06-11 SURGERY — COLONOSCOPY WITH PROPOFOL
Anesthesia: General

## 2021-06-11 MED ORDER — LIDOCAINE HCL (CARDIAC) PF 100 MG/5ML IV SOSY
PREFILLED_SYRINGE | INTRAVENOUS | Status: DC | PRN
  Administered 2021-06-11: 100 mg via INTRAVENOUS

## 2021-06-11 MED ORDER — PROPOFOL 10 MG/ML IV BOLUS
INTRAVENOUS | Status: AC
  Filled 2021-06-11: qty 20

## 2021-06-11 MED ORDER — STERILE WATER FOR IRRIGATION IR SOLN
Status: DC | PRN
  Administered 2021-06-11: 100 mL
  Administered 2021-06-11: 200 mL

## 2021-06-11 MED ORDER — PHENYLEPHRINE HCL (PRESSORS) 10 MG/ML IV SOLN
INTRAVENOUS | Status: DC | PRN
  Administered 2021-06-11: 80 ug via INTRAVENOUS
  Administered 2021-06-11: 120 ug via INTRAVENOUS

## 2021-06-11 MED ORDER — PROPOFOL 10 MG/ML IV BOLUS
INTRAVENOUS | Status: DC | PRN
  Administered 2021-06-11 (×2): 100 mg via INTRAVENOUS
  Administered 2021-06-11 (×2): 50 mg via INTRAVENOUS

## 2021-06-11 MED ORDER — PHENYLEPHRINE 40 MCG/ML (10ML) SYRINGE FOR IV PUSH (FOR BLOOD PRESSURE SUPPORT)
PREFILLED_SYRINGE | INTRAVENOUS | Status: AC
  Filled 2021-06-11: qty 20

## 2021-06-11 MED ORDER — LACTATED RINGERS IV SOLN: INTRAVENOUS | Status: DC

## 2021-06-11 MED ORDER — PROPOFOL 10 MG/ML IV BOLUS
INTRAVENOUS | Status: DC | PRN
Start: 2021-06-11 — End: 2021-06-11
  Administered 2021-06-11: 150 ug/kg/min via INTRAVENOUS

## 2021-06-11 NOTE — Discharge Instructions (Addendum)
Colonoscopy Discharge Instructions  Read the instructions outlined below and refer to this sheet in the next few weeks. These discharge instructions provide you with general information on caring for yourself after you leave the hospital. Your doctor may also give you specific instructions. While your treatment has been planned according to the most current medical practices available, unavoidable complications occasionally occur. If you have any problems or questions after discharge, call Dr. Jena Gauss at [phone]. ACTIVITY You may resume your regular activity, but move at a slower pace for the next 24 hours.  Take frequent rest periods for the next 24 hours.  Walking will help get rid of the air and reduce the bloated feeling in your belly (abdomen).  No driving for 24 hours (because of the medicine (anesthesia) used during the test).   Do not sign any important legal documents or operate any machinery for 24 hours (because of the anesthesia used during the test).  NUTRITION Drink plenty of fluids.  You may resume your normal diet as instructed by your doctor.  Begin with a light meal and progress to your normal diet. Heavy or fried foods are harder to digest and may make you feel sick to your stomach (nauseated).  Avoid alcoholic beverages for 24 hours or as instructed.  MEDICATIONS You may resume your normal medications unless your doctor tells you otherwise.  WHAT YOU CAN EXPECT TODAY Some feelings of bloating in the abdomen.  Passage of more gas than usual.  Spotting of blood in your stool or on the toilet paper.  IF YOU HAD POLYPS REMOVED DURING THE COLONOSCOPY: No aspirin products for 7 days or as instructed.  No alcohol for 7 days or as instructed.  Eat a soft diet for the next 24 hours.  FINDING OUT THE RESULTS OF YOUR TEST Not all test results are available during your visit. If your test results are not back during the visit, make an appointment with your caregiver to find out the  results. Do not assume everything is normal if you have not heard from your caregiver or the medical facility. It is important for you to follow up on all of your test results.  SEEK IMMEDIATE MEDICAL ATTENTION IF: You have more than a spotting of blood in your stool.  Your belly is swollen (abdominal distention).  You are nauseated or vomiting.  You have a temperature over 101.  You have abdominal pain or discomfort that is severe or gets worse throughout the day.  EGD Discharge instructions Please read the instructions outlined below and refer to this sheet in the next few weeks. These discharge instructions provide you with general information on caring for yourself after you leave the hospital. Your doctor may also give you specific instructions. While your treatment has been planned according to the most current medical practices available, unavoidable complications occasionally occur. If you have any problems or questions after discharge, please call your doctor. ACTIVITY You may resume your regular activity but move at a slower pace for the next 24 hours.  Take frequent rest periods for the next 24 hours.  Walking will help expel (get rid of) the air and reduce the bloated feeling in your abdomen.  No driving for 24 hours (because of the anesthesia (medicine) used during the test).  You may shower.  Do not sign any important legal documents or operate any machinery for 24 hours (because of the anesthesia used during the test).  NUTRITION Drink plenty of fluids.  You may resume  your normal diet.  Begin with a light meal and progress to your normal diet.  Avoid alcoholic beverages for 24 hours or as instructed by your caregiver.  MEDICATIONS You may resume your normal medications unless your caregiver tells you otherwise.  WHAT YOU CAN EXPECT TODAY You may experience abdominal discomfort such as a feeling of fullness or "gas" pains.  FOLLOW-UP Your doctor will discuss the results of  your test with you.  SEEK IMMEDIATE MEDICAL ATTENTION IF ANY OF THE FOLLOWING OCCUR: Excessive nausea (feeling sick to your stomach) and/or vomiting.  Severe abdominal pain and distention (swelling).  Trouble swallowing.  Temperature over 101 F (37.8 C).  Rectal bleeding or vomiting of blood.    Your colon looked entirely normal today It is recommended you return in 10 years for repeat average risk screening examination You had a Schatzki's ring and a small hiatal hernia on the upper endoscopy.  Your eosinophilic    esophagitis looked a lot better (biopsies not necessary today). You received a nice dilation today As discussed, we will increase your Zegerid to 40 mg twice daily (before breakfast and supper).  New prescription provided Office visit with [place] in 3 months  At patient request I called Loistine Chance [phone] and reviewed results  GERD information provided

## 2021-06-11 NOTE — Op Note (Addendum)
[facility] Patient Name: Kayla White Procedure Date: 06/11/2021 2:10 PM MRN: [id number] Date of Birth: [birth year] Attending MD: Gennette Pac , MD CSN: [id number] Age: 50 Admit Type: Outpatient Procedure:                Upper GI endoscopy Indications:              Dysphagia Providers:                Gennette Pac, MD, Crystal Page, Pandora Leiter, Technician Referring MD:              Medicines:                Propofol per Anesthesia Complications:            No immediate complications. Estimated Blood Loss:     Estimated blood loss was minimal. Procedure:                Pre-Anesthesia Assessment:                           - Prior to the procedure, a History and Physical                            was performed, and patient medications and                            allergies were reviewed. The patient's tolerance of                            previous anesthesia was also reviewed. The risks                            and benefits of the procedure and the sedation                            options and risks were discussed with the patient.                            All questions were answered, and informed consent                            was obtained. Prior Anticoagulants: The patient has                            taken no previous anticoagulant or antiplatelet                            agents. ASA Grade Assessment: II - A patient with                            mild systemic disease. After reviewing the risks  and benefits, the patient was deemed in                            satisfactory condition to undergo the procedure.                           After obtaining informed consent, the endoscope was                            passed under direct vision. Throughout the                            procedure, the patient's blood pressure, pulse, and                            oxygen saturations  were monitored continuously. The                            [id number]-[id number] ([id number] scope was introduced through the                            mouth, and advanced to the second part of duodenum.                            The upper GI endoscopy was accomplished without                            difficulty. The patient tolerated the procedure                            well. Scope In: 2:23:16 PM Scope Out: 2:33:58 PM Total Procedure Duration: 0 hours 10 minutes 42 seconds  Findings:      A moderate Schatzki ring was found at the gastroesophageal junction. The       scope was withdrawn. Dilation was performed with a Maloney dilator with       no resistance at 52 Fr. The scope was withdrawn. Dilation was performed       with a Maloney dilator with no resistance at 54 Fr. The scope was       withdrawn. Dilation was performed with a Maloney dilator with mild       resistance at 56 Fr. The dilation site was examined following endoscope       reinsertion and showed moderate mucosal disruption. Estimated blood loss       was minimal.      A small hiatal hernia was present.      The exam was otherwise without abnormality.      The duodenal bulb and second portion of the duodenum were normal. Impression:               - Moderate Schatzki ring. Dilated.                           - Small hiatal hernia.                           - The  examination was otherwise normal.                           - Normal duodenal bulb and second portion of the                            duodenum.                           - No specimens collected. Moderate Sedation:      Moderate (conscious) sedation was personally administered by an       anesthesia professional. The following parameters were monitored: oxygen       saturation, heart rate, blood pressure, and response to care. Recommendation:           - Patient has a contact number available for                            emergencies. The signs and symptoms of  potential                            delayed complications were discussed with the                            patient. Return to normal activities tomorrow.                            Written discharge instructions were provided to the                            patient. stop Fluticisone; Increase Zegerid to 40                            mg BID; OV 63mos Procedure Code(s):        --- Professional ---                           [phone], Esophagogastroduodenoscopy, flexible,                            transoral; diagnostic, including collection of                            specimen(s) by brushing or washing, when performed                            (separate procedure)                           43450, Dilation of esophagus, by unguided sound or                            bougie, single or multiple passes Diagnosis Code(s):        --- Professional ---  K22.2, Esophageal obstruction                           K44.9, Diaphragmatic hernia without obstruction or                            gangrene                           R13.10, Dysphagia, unspecified CPT copyright 2019 American Medical Association. All rights reserved. The codes documented in this report are preliminary and upon coder review may  be revised to meet current compliance requirements. [name] [name], MD Gennette Pac, MD 06/11/2021 3:03:02 PM This report has been signed electronically. Number of Addenda: 0

## 2021-06-11 NOTE — H&P (Signed)
@LOGO @   Primary Care Physician:  [facility] Primary Gastroenterologist:  Dr.  Pre-Procedure History & Physical: HPI:  Kayla White is a 50 y.o. female here for further evaluation of dysphagia.  History of EOE and Schatzki's ring dilated to 54 54 back in 2019.  Also, here for screening colonoscopy.  Past Medical History:  Diagnosis Date   Anxiety    Depression    Eosinophilic esophagitis    GERD (gastroesophageal reflux disease)    IBS (irritable bowel syndrome)    Migraines    PONV (postoperative nausea and vomiting)     Past Surgical History:  Procedure Laterality Date   BIOPSY  10/19/2018   Procedure: BIOPSY;  Surgeon: 10/21/2018, MD;  Location: [facility];  Service: Endoscopy;;  gastric   CHOLECYSTECTOMY N/A 08/14/2018   Procedure: LAPAROSCOPIC CHOLECYSTECTOMY;  Surgeon: 08/16/2018, MD;  Location: [facility] OR[facility];  Service: General;  Laterality: N/A;   COLONOSCOPY WITH ESOPHAGOGASTRODUODENOSCOPY (EGD)  06/2018   Dr. 07/2018: EGD "normal".  Colonoscopy with hemorrhoids.   DILATION AND CURETTAGE OF UTERUS  2009   ESOPHAGOGASTRODUODENOSCOPY (EGD) WITH PROPOFOL N/A 10/19/2018   Dr. 10/21/2018: Mild Schatzki ring found at [facility] with tiny erosions.  Mild longitudinal furrows of the tubular esophagus.  Small hiatal hernia.  Multiple erosions in the stomach.  Diffuse erythematous mucosa found in the entire stomach.  Esophagus dilated up to 54 Jena Gauss.  Gastric biopsy mild reactive gastropathy, mild chronic gastritis. BX esophagitis with up to 30/HPF intraepithelial EOS   IVF  2007, 099 amd 2010   LAPAROSCOPIC ABDOMINAL EXPLORATION     R/o endometriosis   MALONEY DILATION N/A 10/19/2018   Procedure: 10/21/2018 DILATION;  Surgeon: Elease Hashimoto, MD;  Location: [facility];  Service: Endoscopy;  Laterality: N/A;    Prior to Admission medications   Medication Sig Start Date End Date Taking? Authorizing Provider  acetaminophen (TYLENOL) 500 MG tablet  Take 500-1,000 mg by mouth every 6 (six) hours as needed (for pain.).    Yes [provider]  calcium carbonate (TUMS - DOSED IN MG ELEMENTAL CALCIUM) 500 MG chewable tablet Chew 1 tablet by mouth 2 (two) times daily as needed for indigestion or heartburn.   Yes [provider]  fluticasone (FLOVENT HFA) 220 MCG/ACT inhaler Two sprays into the mouth and swallow twice daily. Do not eat or drink for 30 minutes afterwards. Rinse mouth out (swish and spit) 30 minutes after administration. Patient taking differently: Inhale 2 puffs into the lungs 2 (two) times daily as needed (respiratory issues). Two sprays into the mouth and swallow twice daily. Do not eat or drink for 30 minutes afterwards. Rinse mouth out (swish and spit) 30 minutes after administration. 03/21/21  Yes 03/23/21, PA-C  halobetasol (ULTRAVATE) 0.05 % cream Apply 1 application topically 2 (two) times daily as needed (for ezcema).   Yes [provider]  linaclotide Tiffany Kocher) 145 MCG CAPS capsule Take 1 capsule (145 mcg total) by mouth daily before breakfast. 03/23/21  Yes 03/25/21, PA-C  Nutritional Supplements (JUICE PLUS FIBRE PO) Take 6 tablets by mouth in the morning. Juice Plus Fruit (2) + Vegetable (2) + Berry (2)   Yes [provider]  Omeprazole-Sodium Bicarbonate (ZEGERID) 20-1100 MG CAPS capsule Take 1 capsule by mouth in the morning.   Yes [provider]  polyethylene glycol-electrolytes (TRILYTE) 420 g solution Take 4,000 mLs by mouth as directed. 04/30/21  Yes Gabrella Stroh, 06/30/21, MD  Probiotic Product (  PROBIOTIC PO) Take 1 capsule by mouth in the morning.   Yes [provider]  sertraline (ZOLOFT) 100 MG tablet Take 200 mg by mouth at bedtime.  04/16/16  Yes [provider]  TURMERIC PO Take 1,000 mg by mouth daily as needed (joint pain).   Yes [provider]    Allergies as of 04/30/2021 - Review Complete 03/21/2021  Allergen Reaction Noted   Bee  venom Anaphylaxis and Swelling 08/10/2018   Eggs or egg-derived products Anaphylaxis 10/09/2020   Shellfish allergy Hives 10/16/2018   Penicillins Rash 06/04/2016    Family History  Problem Relation Age of Onset   Congestive Heart Failure Father    Dementia Father    Thyroid disease Father    Arthritis Mother    Hypertension Mother    Alzheimer's disease Paternal Grandmother    Cancer Paternal Grandfather    Cancer Maternal Grandfather    Anxiety disorder Son    Depression Son    OCD Son    Autism Son    Hearing loss Son        has hearing aid   Colon cancer Neg Hx    Colon polyps Neg Hx     Social History   Socioeconomic History   Marital status: Married    Spouse name: Not on file   Number of children: 4   Years of education: 18   Highest education level: Not on file  Occupational History   Occupation: Real estate agent  Tobacco Use   Smoking status: Never   Smokeless tobacco: Never  Vaping Use   Vaping Use: Never used  Substance and Sexual Activity   Alcohol use: No    Alcohol/week: 0.0 standard drinks   Drug use: No   Sexual activity: Yes    Birth control/protection: None    Comment: husband had vasectomy  Other Topics Concern   Not on file  Social History Narrative   Lives at home w/ her husband and 4 children   Right-handed   Rare caffeine   Social Determinants of Health   Financial Resource Strain: Not on file  Food Insecurity: Not on file  Transportation Needs: Not on file  Physical Activity: Not on file  Stress: Not on file  Social Connections: Not on file  Intimate Partner Violence: Not on file    Review of Systems: See HPI, otherwise negative ROS  Physical Exam: LMP 05/28/2021 (Exact Date)  General:   Alert,  Well-developed, well-nourished, pleasant and cooperative in NAD Neck:  Supple; no masses or thyromegaly. No significant cervical adenopathy. Lungs:  Clear throughout to auscultation.   No wheezes, crackles, or rhonchi. No acute  distress. Heart:  Regular rate and rhythm; no murmurs, clicks, rubs,  or gallops. Abdomen: Non-distended, normal bowel sounds.  Soft and nontender without appreciable mass or hepatosplenomegaly.  Pulses:  Normal pulses noted. Extremities:  Without clubbing or edema.  Impression/Plan: 50 year old lady with a history of increased esophageal eosinophils the Schatzki's ring dilated previously.  Now with recurrent esophageal dysphagia.  Also here for screening colonoscopy.  I have offered the patient both an EGD and a colonoscopy  The risks, benefits, limitations, imponderables and alternatives regarding both EGD and colonoscopy have been reviewed with the patient. Questions have been answered. All parties agreeable.      Notice: This dictation was prepared with Dragon dictation along with smaller phrase technology. Any transcriptional errors that result from this process are unintentional and may not be corrected upon review.

## 2021-06-11 NOTE — Op Note (Signed)
[facility] Patient Name: Kayla White Procedure Date: 06/11/2021 2:38 PM MRN: [id number] Date of Birth: 01/19/1971 Attending MD: Gennette Pac , MD CSN: [id number] Age: 50 Admit Type: Outpatient Procedure:                Colonoscopy Indications:              Screening for colorectal malignant neoplasm Providers:                Gennette Pac, MD, Crystal Page, Pandora Leiter, Technician Referring MD:              Medicines:                Propofol per Anesthesia Complications:            No immediate complications. Estimated Blood Loss:     Estimated blood loss: none. Procedure:                Pre-Anesthesia Assessment:                           - Prior to the procedure, a History and Physical                            was performed, and patient medications and                            allergies were reviewed. The patient's tolerance of                            previous anesthesia was also reviewed. The risks                            and benefits of the procedure and the sedation                            options and risks were discussed with the patient.                            All questions were answered, and informed consent                            was obtained. Prior Anticoagulants: The patient has                            taken no previous anticoagulant or antiplatelet                            agents. ASA Grade Assessment: II - A patient with                            mild systemic disease. After reviewing the risks  and benefits, the patient was deemed in                            satisfactory condition to undergo the procedure.                           After obtaining informed consent, the colonoscope                            was passed under direct vision. Throughout the                            procedure, the patient's blood pressure, pulse, and                             oxygen saturations were monitored continuously. The                            [id number]-HQ190L ([id number]) scope was introduced through                            the anus and advanced to the 10 cm into the ileum.                            The terminal ileum, ileocecal valve, appendiceal                            orifice, and rectum were photographed. Scope In: 2:41:12 PM Scope Out: 2:57:28 PM Scope Withdrawal Time: 0 hours 11 minutes 30 seconds  Total Procedure Duration: 0 hours 16 minutes 16 seconds  Findings:      The perianal and digital rectal examinations were normal.      The colon (entire examined portion) appeared normal.      The retroflexed view of the distal rectum and anal verge was normal and       showed no anal or rectal abnormalities. Distal 10 cm appeared normal Impression:               - The entire examined colon is normal.                           - The distal rectum and anal verge are normal on                            retroflexion view.                           - No specimens collected. Moderate Sedation:      Moderate (conscious) sedation was personally administered by an       anesthesia professional. The following parameters were monitored: oxygen       saturation, heart rate, blood pressure, respiratory rate, EKG, adequacy       of pulmonary ventilation, and response to care. Recommendation:           - Patient has a contact number available for  emergencies. The signs and symptoms of potential                            delayed complications were discussed with the                            patient. Return to normal activities tomorrow.                            Written discharge instructions were provided to the                            patient.                           - Advance diet as tolerated.                           - Continue present medications.                           - Repeat colonoscopy in 10 years for  screening                            purposes.                           - Return to GI office in 3 months. See EGD report Procedure Code(s):        --- Professional ---                           [phone], Colonoscopy, flexible; diagnostic, including                            collection of specimen(s) by brushing or washing,                            when performed (separate procedure) Diagnosis Code(s):        --- Professional ---                           Z12.11, Encounter for screening for malignant                            neoplasm of colon CPT copyright 2019 American Medical Association. All rights reserved. The codes documented in this report are preliminary and upon coder review may  be revised to meet current compliance requirements. [name] Inika Bellanger, MD Gennette Pac, MD 06/11/2021 3:06:08 PM This report has been signed electronically. Number of Addenda: 0

## 2021-06-11 NOTE — Anesthesia Preprocedure Evaluation (Addendum)
Anesthesia Evaluation  Patient identified by MRN, date of birth, ID band Patient awake    Reviewed: Allergy & Precautions, NPO status , Patient's Chart, lab work & pertinent test results  History of Anesthesia Complications (+) PONV and history of anesthetic complications  Airway Mallampati: II  TM Distance: >3 FB Neck ROM: Full    Dental  (+) Dental Advisory Given, Teeth Intact   Pulmonary neg pulmonary ROS,    Pulmonary exam normal breath sounds clear to auscultation       Cardiovascular Exercise Tolerance: Good Normal cardiovascular exam Rhythm:Regular Rate:Normal     Neuro/Psych  Headaches, PSYCHIATRIC DISORDERS Anxiety Depression  Neuromuscular disease    GI/Hepatic Neg liver ROS, GERD  Medicated,  Endo/Other  negative endocrine ROS  Renal/GU negative Renal ROS     Musculoskeletal negative musculoskeletal ROS (+)   Abdominal   Peds  Hematology negative hematology ROS (+)   Anesthesia Other Findings   Reproductive/Obstetrics negative OB ROS                            Anesthesia Physical Anesthesia Plan  ASA: 2  Anesthesia Plan: General   Post-op Pain Management:    Induction:   PONV Risk Score and Plan: Propofol infusion  Airway Management Planned: Natural Airway and Nasal Cannula  Additional Equipment:   Intra-op Plan:   Post-operative Plan:   Informed Consent: I have reviewed the patients History and Physical, chart, labs and discussed the procedure including the risks, benefits and alternatives for the proposed anesthesia with the patient or authorized representative who has indicated his/her understanding and acceptance.     Dental advisory given  Plan Discussed with: CRNA and Surgeon  Anesthesia Plan Comments:         Anesthesia Quick Evaluation

## 2021-06-11 NOTE — Anesthesia Postprocedure Evaluation (Signed)
Anesthesia Post Note  Patient: Kayla White  Procedure(s) Performed: COLONOSCOPY WITH PROPOFOL ESOPHAGOGASTRODUODENOSCOPY (EGD) WITH PROPOFOL ESOPHAGEAL DILATION  Patient location during evaluation: Endoscopy Anesthesia Type: General Level of consciousness: awake and alert and oriented Pain management: pain level controlled Vital Signs Assessment: post-procedure vital signs reviewed and stable Respiratory status: spontaneous breathing and respiratory function stable Cardiovascular status: blood pressure returned to baseline and stable Postop Assessment: no apparent nausea or vomiting Anesthetic complications: no   No notable events documented.   Last Vitals:  Vitals:   06/11/21 1355 06/11/21 1459  BP: (!) 119/52 (!) 91/53  Pulse: 69   Resp: 15 (!) 22  Temp: 36.8 C 36.4 C  SpO2: 100% 100%    Last Pain:  Vitals:   06/11/21 1459  TempSrc: Oral  PainSc:                  Kayla White

## 2021-06-11 NOTE — Transfer of Care (Signed)
Immediate Anesthesia Transfer of Care Note  Patient: Kayla White  Procedure(s) Performed: COLONOSCOPY WITH PROPOFOL ESOPHAGOGASTRODUODENOSCOPY (EGD) WITH PROPOFOL ESOPHAGEAL DILATION  Patient Location: Endoscopy Unit  Anesthesia Type:General  Level of Consciousness: awake, alert  and oriented  Airway & Oxygen Therapy: Patient Spontanous Breathing  Post-op Assessment: Report given to RN and Post -op Vital signs reviewed and stable  Post vital signs: Reviewed and stable  Last Vitals:  Vitals Value Taken Time  BP    Temp    Pulse 72 06/11/21 1500  Resp 19 06/11/21 1500  SpO2 99 06/11/21 1500    Last Pain:  Vitals:   06/11/21 1355  TempSrc: Oral  PainSc: 0-No pain      Patients Stated Pain Goal: 9 (06/11/21 1355)  Complications: No notable events documented.

## 2021-06-13 ENCOUNTER — Telehealth: Payer: Self-pay | Admitting: Internal Medicine

## 2021-06-13 NOTE — Telephone Encounter (Signed)
Pt said that Dr Jena Gauss sent a prescription to [facility] pharmacy and was told that Zegrid was no longer on the market. She needs another prescription for something else. Please call [phone]

## 2021-06-14 MED ORDER — PANTOPRAZOLE SODIUM 40 MG PO TBEC: 40.0000 mg | DELAYED_RELEASE_TABLET | Freq: Two times a day (BID) | ORAL | 5 refills | Status: DC

## 2021-06-14 NOTE — Telephone Encounter (Signed)
Lmom for pt. To call office back. 

## 2021-06-14 NOTE — Telephone Encounter (Signed)
Spoke to pt. Informed her of recommendations. She said which medication will treat or help her ELE ?

## 2021-06-14 NOTE — Telephone Encounter (Signed)
Options are to increase omeprazole to 40mg  BID or switch her to a different PPI. She has already been on Dexilant. We could try pantoprazole, Nexium, or Aciphex.   Let me know what she decides.

## 2021-06-14 NOTE — Telephone Encounter (Signed)
RX done. 

## 2021-06-14 NOTE — Telephone Encounter (Signed)
EOE is treated by fluticasone and any of the previously mentioned PPIs.   Dr. Jena Gauss specifically advised her to stop fluticasone at time of upper endoscopy 3 days ago and wanted her to increase her PPI.   So I would recommend try any of the follow PPIs that are covered: pantoprazole, nexium or aciphex. If she doesn't have a choice we will start with pantoprazole 40mg  BID before a meal. #60 with 5 refills.

## 2021-06-14 NOTE — Telephone Encounter (Signed)
Spoke to pt. Informed her of recommendations. She would like to try pantoprazole 40mg  BID.

## 2021-06-14 NOTE — Telephone Encounter (Signed)
Spoke to Anon Raices at [facility] and Zegrid has been discontinued. Called pt. And spoke to her she said she has been taken over the counter Omeprazole 40mg . But needs something stronger. What are your recommendations?    Routing to in absents of Dr. Phelps Dodge

## 2021-06-14 NOTE — Addendum Note (Signed)
Addended by: Tiffany Kocher on: 06/14/2021 04:19 PM   Modules accepted: Orders

## 2021-06-15 ENCOUNTER — Encounter (HOSPITAL_COMMUNITY): Payer: Self-pay | Admitting: Internal Medicine

## 2021-10-13 NOTE — Progress Notes (Deleted)
Referring Provider: [facility] Primary Care Physician:  [facility] Primary GI Physician: Dr. Jena Gauss  No chief complaint on file.   HPI:   Kayla White is a 50 y.o. female presenting today for follow-up on IBS-C, EOE, dysphagia.  She has history of IBS-C, Schatzki's ring, EOE, gastritis.  Food allergy testing was positive to peanut, egg, milk, oat, barley, rye, wheat, tomato, cucumber, ginger, garlic, sesame, coconut.  She was started on treatment for EOE in 2020 with viscous budesonide 2 mg daily as insurance would not cover fluticasone, but only took this for about 2 weeks.    Last seen in our office 03/21/2021.  She continued with issues with her esophagus.  Describes some difficulty swallowing solid foods and large pills at times, burning, discomfort in the substernal region.  Typically avoiding food allergies, but did mention eating peanut butter.  She was taking OTC Zegerid daily.  Regarding IBS-C, she was using Juice Plus, Vear Clock' colon health probiotic, glycerin suppository once a week.  Also reported pain in the right abdomen at the level of the umbilicus worsened by meals 3 times a week.  Also thought she might have a right inguinal hernia.  Abdominal exam with some fullness in the right mid abdomen, no appreciable hernia.  Plan included continuing OTC Zegerid, add fluticasone 220 mg, 2 sprays in the mouth and swallowed twice daily, consider EGD to evaluate symptoms, consider dilation, start Amitiza 24 mcg twice daily, and arrange screening colonoscopy at patient's request.  Patient called reporting medications would be too expensive.  Recommended Linzess 145 mcg.  Plan to try compounded viscous budesonide  Procedures completed 06/11/2021: Colonoscopy: Normal exam.  Repeat in 10 years. EGD: Moderate Schatzki's ring at GE junction s/p dilation, small hiatal hernia, otherwise normal exam.  Recommended stopping fluticasone and increasing Zegerid  to 40 mg twice daily.  Pharmacist advised that Zegerid has been discontinued.  She was started on Protonix 40 mg twice daily.  Today:   Past Medical History:  Diagnosis Date   Anxiety    Depression    Eosinophilic esophagitis    GERD (gastroesophageal reflux disease)    IBS (irritable bowel syndrome)    Migraines    PONV (postoperative nausea and vomiting)     Past Surgical History:  Procedure Laterality Date   BIOPSY  10/19/2018   Procedure: BIOPSY;  Surgeon: Corbin Ade, MD;  Location: [facility];  Service: Endoscopy;;  gastric   CHOLECYSTECTOMY N/A 08/14/2018   Procedure: LAPAROSCOPIC CHOLECYSTECTOMY;  Surgeon: Franky Macho, MD;  Location: [facility];  Service: General;  Laterality: N/A;   COLONOSCOPY WITH ESOPHAGOGASTRODUODENOSCOPY (EGD)  06/2018   Dr. Gabriel Cirri: EGD "normal".  Colonoscopy with hemorrhoids.   COLONOSCOPY WITH PROPOFOL N/A 06/11/2021   Procedure: COLONOSCOPY WITH PROPOFOL;  Surgeon: Corbin Ade, MD;  Location: [facility];  Service: Endoscopy;  Laterality: N/A;  2:45pm   DILATION AND CURETTAGE OF UTERUS  2009   ESOPHAGEAL DILATION  06/11/2021   Procedure: ESOPHAGEAL DILATION;  Surgeon: Corbin Ade, MD;  Location: [facility];  Service: Endoscopy;;   ESOPHAGOGASTRODUODENOSCOPY (EGD) WITH PROPOFOL N/A 10/19/2018   Dr. Jena Gauss: Mild Schatzki ring found at GEJ with tiny erosions.  Mild longitudinal furrows of the tubular esophagus.  Small hiatal hernia.  Multiple erosions in the stomach.  Diffuse erythematous mucosa found in the entire stomach.  Esophagus dilated up to [address] [place].  Gastric biopsy mild reactive gastropathy, mild chronic gastritis. BX esophagitis with up to  30/HPF intraepithelial EOS   ESOPHAGOGASTRODUODENOSCOPY (EGD) WITH PROPOFOL N/A 06/11/2021   Procedure: ESOPHAGOGASTRODUODENOSCOPY (EGD) WITH PROPOFOL;  Surgeon: Corbin Ade, MD;  Location: [facility];  Service: Endoscopy;  Laterality: N/A;   IVF  2007, 099 amd 2010   LAPAROSCOPIC  ABDOMINAL EXPLORATION     R/o endometriosis   MALONEY DILATION N/A 10/19/2018   Procedure: Elease Hashimoto DILATION;  Surgeon: Corbin Ade, MD;  Location: [facility];  Service: Endoscopy;  Laterality: N/A;    Current Outpatient Medications  Medication Sig Dispense Refill   acetaminophen (TYLENOL) 500 MG tablet Take 500-1,000 mg by mouth every 6 (six) hours as needed (for pain.).      calcium carbonate (TUMS - DOSED IN MG ELEMENTAL CALCIUM) 500 MG chewable tablet Chew 1 tablet by mouth 2 (two) times daily as needed for indigestion or heartburn.     halobetasol (ULTRAVATE) 0.05 % cream Apply 1 application topically 2 (two) times daily as needed (for ezcema).     linaclotide (LINZESS) 145 MCG CAPS capsule Take 1 capsule (145 mcg total) by mouth daily before breakfast. 30 capsule 3   Nutritional Supplements (JUICE PLUS FIBRE PO) Take 6 tablets by mouth in the morning. Juice Plus Fruit (2) + Vegetable (2) + Berry (2)     pantoprazole (PROTONIX) 40 MG tablet Take 1 tablet (40 mg total) by mouth 2 (two) times daily before a meal. 60 tablet 5   polyethylene glycol-electrolytes (TRILYTE) 420 g solution Take 4,000 mLs by mouth as directed. 4000 mL 0   Probiotic Product (PROBIOTIC PO) Take 1 capsule by mouth in the morning.     sertraline (ZOLOFT) 100 MG tablet Take 200 mg by mouth at bedtime.      TURMERIC PO Take 1,000 mg by mouth daily as needed (joint pain).     No current facility-administered medications for this visit.    Allergies as of 10/15/2021 - Review Complete 06/11/2021  Allergen Reaction Noted   Bee venom Anaphylaxis and Swelling 08/10/2018   Eggs or egg-derived products Anaphylaxis 10/09/2020   Shellfish allergy Hives 10/16/2018   Penicillins Rash 06/04/2016    Family History  Problem Relation Age of Onset   Congestive Heart Failure Father    Dementia Father    Thyroid disease Father    Arthritis Mother    Hypertension Mother    Alzheimer's disease Paternal Grandmother     Cancer Paternal Grandfather    Cancer Maternal Grandfather    Anxiety disorder Son    Depression Son    OCD Son    Autism Son    Hearing loss Son        has hearing aid   Colon cancer Neg Hx    Colon polyps Neg Hx     Social History   Socioeconomic History   Marital status: Married    Spouse name: Not on file   Number of children: 4   Years of education: 18   Highest education level: Not on file  Occupational History   Occupation: Real estate agent  Tobacco Use   Smoking status: Never   Smokeless tobacco: Never  Vaping Use   Vaping Use: Never used  Substance and Sexual Activity   Alcohol use: No    Alcohol/week: 0.0 standard drinks   Drug use: No   Sexual activity: Yes    Birth control/protection: None    Comment: husband had vasectomy  Other Topics Concern   Not on file  Social History Narrative  Lives at home w/ her husband and 4 children   Right-handed   Rare caffeine   Social Determinants of Health   Financial Resource Strain: Not on file  Food Insecurity: Not on file  Transportation Needs: Not on file  Physical Activity: Not on file  Stress: Not on file  Social Connections: Not on file    Review of Systems: Gen: Denies fever, chills, anorexia. Denies fatigue, weakness, weight loss.  CV: Denies chest pain, palpitations, syncope, peripheral edema, and claudication. Resp: Denies dyspnea at rest, cough, wheezing, coughing up blood, and pleurisy. GI: Denies vomiting blood, jaundice, and fecal incontinence.   Denies dysphagia or odynophagia. Derm: Denies rash, itching, dry skin Psych: Denies depression, anxiety, memory loss, confusion. No homicidal or suicidal ideation.  Heme: Denies bruising, bleeding, and enlarged lymph nodes.  Physical Exam: There were no vitals taken for this visit. General:   Alert and oriented. No distress noted. Pleasant and cooperative.  Head:  Normocephalic and atraumatic. Eyes:  Conjuctiva clear without scleral  icterus. Mouth:  Oral mucosa pink and moist. Good dentition. No lesions. Heart:  S1, S2 present without murmurs appreciated. Lungs:  Clear to auscultation bilaterally. No wheezes, rales, or rhonchi. No distress.  Abdomen:  +BS, soft, non-tender and non-distended. No rebound or guarding. No HSM or masses noted. Msk:  Symmetrical without gross deformities. Normal posture. Extremities:  Without edema. Neurologic:  Alert and  oriented x4 Psych:  Alert and cooperative. Normal mood and affect.

## 2021-10-15 ENCOUNTER — Encounter: Payer: Self-pay | Admitting: Gastroenterology

## 2021-10-15 ENCOUNTER — Ambulatory Visit: Payer: 59 | Admitting: Gastroenterology

## 2021-10-24 ENCOUNTER — Other Ambulatory Visit (HOSPITAL_COMMUNITY): Payer: Self-pay | Admitting: Obstetrics & Gynecology

## 2021-10-24 DIAGNOSIS — Z1231 Encounter for screening mammogram for malignant neoplasm of breast: Secondary | ICD-10-CM

## 2021-11-05 ENCOUNTER — Other Ambulatory Visit: Payer: Self-pay

## 2021-11-05 ENCOUNTER — Ambulatory Visit (HOSPITAL_COMMUNITY)
Admission: RE | Admit: 2021-11-05 | Discharge: 2021-11-05 | Disposition: A | Discharge status: Home / Self Care | Payer: 59 | Source: Ambulatory Visit | Attending: Obstetrics & Gynecology | Admitting: Obstetrics & Gynecology

## 2021-11-05 DIAGNOSIS — Z1231 Encounter for screening mammogram for malignant neoplasm of breast: Secondary | ICD-10-CM | POA: Diagnosis present

## 2022-01-28 ENCOUNTER — Ambulatory Visit (INDEPENDENT_AMBULATORY_CARE_PROVIDER_SITE_OTHER): Payer: Self-pay | Admitting: Neurology

## 2022-01-28 ENCOUNTER — Encounter: Payer: Self-pay | Admitting: Neurology

## 2022-01-28 ENCOUNTER — Telehealth: Payer: Self-pay | Admitting: Neurology

## 2022-01-28 DIAGNOSIS — Z91199 Patient's noncompliance with other medical treatment and regimen due to unspecified reason: Secondary | ICD-10-CM

## 2022-01-28 NOTE — Telephone Encounter (Signed)
This patient has NO SHOWED every appointment at [facility] since 2017: ?08/2016 - no show ?09/2016 - now show ?03/2018 - no show ?01/31/2020 - no show ?01/28/2022 - no show ? ?Last seen by Dr. Jannifer Franklin 12/24/2015 ? ?We would ask referring doctor to please stop referring her to [facility], we will not be seeing her sue to hx of multiple no shows ? ?

## 2022-01-28 NOTE — Progress Notes (Signed)
This patient has NO SHOWED every appointment at [facility] since 2017: ?08/2016 - no show ?09/2016 - now show ?03/2018 - no show ?01/31/2020 - no show ?01/28/2022 - no show ? ?Last seen by Dr. Anne Hahn 12/24/2015 ? ?We would ask referring doctor to please STOP referring her to [facility], we will not be seeing her due to hx of multiple no shows ?

## 2022-03-08 ENCOUNTER — Other Ambulatory Visit: Payer: Self-pay | Admitting: Obstetrics & Gynecology

## 2022-03-19 ENCOUNTER — Other Ambulatory Visit: Payer: 59 | Admitting: Women's Health

## 2022-04-02 ENCOUNTER — Ambulatory Visit (INDEPENDENT_AMBULATORY_CARE_PROVIDER_SITE_OTHER): Payer: 59 | Admitting: Women's Health

## 2022-04-02 ENCOUNTER — Other Ambulatory Visit (HOSPITAL_COMMUNITY)
Admission: RE | Admit: 2022-04-02 | Discharge: 2022-04-02 | Disposition: A | Discharge status: Home / Self Care | Payer: 59 | Source: Ambulatory Visit | Attending: Obstetrics & Gynecology | Admitting: Obstetrics & Gynecology

## 2022-04-02 ENCOUNTER — Encounter: Payer: Self-pay | Admitting: Women's Health

## 2022-04-02 VITALS — BP 115/51 | HR 70 | Ht 69.0 in | Wt 167.0 lb

## 2022-04-02 DIAGNOSIS — Z1329 Encounter for screening for other suspected endocrine disorder: Secondary | ICD-10-CM | POA: Diagnosis not present

## 2022-04-02 DIAGNOSIS — Z01419 Encounter for gynecological examination (general) (routine) without abnormal findings: Secondary | ICD-10-CM | POA: Diagnosis not present

## 2022-04-02 DIAGNOSIS — Z13220 Encounter for screening for lipoid disorders: Secondary | ICD-10-CM | POA: Diagnosis not present

## 2022-04-02 DIAGNOSIS — N8110 Cystocele, unspecified: Secondary | ICD-10-CM | POA: Diagnosis not present

## 2022-04-02 DIAGNOSIS — N816 Rectocele: Secondary | ICD-10-CM

## 2022-04-02 DIAGNOSIS — Z1321 Encounter for screening for nutritional disorder: Secondary | ICD-10-CM

## 2022-04-02 DIAGNOSIS — Z131 Encounter for screening for diabetes mellitus: Secondary | ICD-10-CM | POA: Diagnosis not present

## 2022-04-02 DIAGNOSIS — R638 Other symptoms and signs concerning food and fluid intake: Secondary | ICD-10-CM

## 2022-04-02 DIAGNOSIS — R5383 Other fatigue: Secondary | ICD-10-CM | POA: Diagnosis not present

## 2022-04-02 DIAGNOSIS — N3946 Mixed incontinence: Secondary | ICD-10-CM | POA: Diagnosis not present

## 2022-04-02 DIAGNOSIS — R61 Generalized hyperhidrosis: Secondary | ICD-10-CM

## 2022-04-02 MED ORDER — BUPROPION HCL ER (XL) 150 MG PO TB24: 150.0000 mg | ORAL_TABLET | Freq: Every morning | ORAL | 6 refills | Status: DC

## 2022-04-02 MED ORDER — SERTRALINE HCL 100 MG PO TABS: 200.0000 mg | ORAL_TABLET | Freq: Every day | ORAL | 3 refills | Status: DC

## 2022-04-02 NOTE — Progress Notes (Signed)
? ?WELL-WOMAN EXAMINATION ?Patient name: Kayla White MRN [id number]  Date of birth: 24-Jan-1971 ?Chief Complaint:   ?Gynecologic Exam (Wants fasting labs; don't have energy; night sweats; wants hormones checked) ? ?History of Present Illness:   ?Kayla White is a 51 y.o. [phone] Caucasian female being seen today for a routine well-woman exam. Monthly periods q 28-32d, last 24-72hrs, bad headaches, heavier flow lately-changes thin pad q 3-4hrs, small clots (<quarter size). Night sweats, some vaginal dryness, just doesn't feel like herself, fatigue, decreased energy, mood swings. Mom was in 17s when she went through menopause, younger sister is in 96s and feels she is going through menopausal symptoms.On zoloft 200mg  x 80yrs for OCD, needs refilled and feels she may have some depression-wants to try adding wellbutrin.  ?Has cystocele and leaks urine w/ coughing, sneezing, and urge. Wears Poise pads daily. Has tried Kegel's and hasn't helped. Has IBS-C, on Linzess.  ? ?PCP: [facility]      ?does desire labs ?No LMP recorded. ?The current method of family planning is  not really having sex, has decreased sex drive .  ?Last pap 10/09/20. Results were: NILM w/ HRHPV negative. H/O abnormal pap: no ?Last mammogram: 11/05/21. Results were: normal. Family h/o breast cancer: no ?Last colonoscopy: 06/11/21. Results were: normal. Family h/o colorectal cancer: no ? ? ?  04/02/2022  ?  3:38 PM  ?Depression screen PHQ 2/9  ?Decreased Interest 1  ?Down, Depressed, Hopeless 1  ?PHQ - 2 Score 2  ?Altered sleeping 1  ?Tired, decreased energy 1  ?Change in appetite 0  ?Feeling bad or failure about yourself  1  ?Trouble concentrating 1  ?Moving slowly or fidgety/restless 0  ?Suicidal thoughts 0  ?PHQ-9 Score 6  ? ?  ? ?  04/02/2022  ?  3:38 PM  ?GAD 7 : Generalized Anxiety Score  ?Nervous, Anxious, on Edge 1  ?Control/stop worrying 1  ?Worry too much - different things 1  ?Trouble relaxing 1  ?Restless 1  ?Easily annoyed  or irritable 1  ?Afraid - awful might happen 0  ?Total GAD 7 Score 6  ? ? ? ?Review of Systems:   ?Pertinent items are noted in HPI ?Denies any headaches, blurred vision, fatigue, shortness of breath, chest pain, abdominal pain, abnormal vaginal discharge/itching/odor/irritation, problems with periods, bowel movements, urination, or intercourse unless otherwise stated above. ?Pertinent History Reviewed:  ?Reviewed past medical,surgical, social and family history.  ?Reviewed problem list, medications and allergies. ?Physical Assessment:  ? ?Vitals:  ? 04/02/22 1523  ?BP: (!) 115/51  ?Pulse: 70  ?Weight: 167 lb (75.8 kg)  ?Height: 5\' 9"  (1.753 m)  ?Body mass index is 24.66 kg/m?. ?  ?     Physical Examination:  ? General appearance - well appearing, and in no distress ? Mental status - alert, oriented to person, place, and time ? Psych:  She has a normal mood and affect ? Skin - warm and dry, normal color, no suspicious lesions noted ? Chest - effort normal, all lung fields clear to auscultation bilaterally ? Heart - normal rate and regular rhythm ? Neck:  midline trachea, no thyromegaly or nodules ? Breasts - breasts appear normal, no suspicious masses, no skin or nipple changes or  axillary nodes ? Abdomen - soft, nontender, nondistended, no masses or organomegaly ? Pelvic - VULVA: normal appearing vulva with no masses, tenderness or lesions  VAGINA: normal appearing vagina with normal color and discharge, no lesions, +cystocele and rectocele  CERVIX: normal appearing cervix without discharge  or lesions, no CMT ? Thin prep pap is done w/ HR HPV cotesting ? UTERUS: uterus is felt to be normal size, shape, consistency and nontender  ? ADNEXA: No adnexal masses or tenderness noted. ? Extremities:  No swelling or varicosities noted ? ?Chaperone: Malachy Mood   ? ?No results found for this or any previous visit (from the past 24 hour(s)).  ?Assessment & Plan:  ?1) Well-Woman Exam ? ?2) Night sweats, vaginal dryness, mood  swings, fatigue, no energy> will check labs ? ?3) Cystocele and rectocele w/ mixed urinary incontinence> discussed, not sure if wants surgical management, referral to uro/gyn placed to discuss options ? ?4) OCD, mild depression> refilled zoloft 200mg , will try wellbutin XL 150mg  daily, f/u 4wks ? ? ?Labs/procedures today: pap, labs as below ? ?Mammogram:  in Dec , or sooner if problems ?Colonoscopy: per GI, or sooner if problems ? ?Orders Placed This Encounter  ?Procedures  ? CBC  ? Comprehensive metabolic panel  ? TSH  ? Lipid panel  ? Hemoglobin A1c  ? VITAMIN D 25 Hydroxy (Vit-D Deficiency, Fractures)  ? Follicle stimulating hormone  ? Ambulatory referral to Urogynecology  ? ? ?Meds:  ?Meds ordered this encounter  ?Medications  ? buPROPion (WELLBUTRIN XL) 150 MG 24 hr tablet  ?  Sig: Take 1 tablet (150 mg total) by mouth in the morning.  ?  Dispense:  30 tablet  ?  Refill:  6  ?  Order Specific Question:   Supervising Provider  ?  Answer:   H [[id number]]  ? sertraline (ZOLOFT) 100 MG tablet  ?  Sig: Take 2 tablets (200 mg total) by mouth at bedtime.  ?  Dispense:  180 tablet  ?  Refill:  3  ?  Order Specific Question:   Supervising Provider  ?  Answer:   Jan H [[id number]]  ? ? ?Follow-up: Return in about 4 weeks (around 04/30/2022) for med f/u, MyChart Video. ? ?Duane Lope CNM, WHNP-BC ?04/02/2022 1620 ?

## 2022-04-03 LAB — COMPREHENSIVE METABOLIC PANEL
ALT: 15 IU/L (ref 0–32)
AST: 17 IU/L (ref 0–40)
Albumin/Globulin Ratio: 1.6 (ref 1.2–2.2)
Albumin: 4.3 g/dL (ref 3.8–4.8)
Alkaline Phosphatase: 54 IU/L (ref 44–121)
BUN/Creatinine Ratio: 12 (ref 9–23)
BUN: 10 mg/dL (ref 6–24)
Bilirubin Total: 0.5 mg/dL (ref 0.0–1.2)
CO2: 23 mmol/L (ref 20–29)
Calcium: 9.1 mg/dL (ref 8.7–10.2)
Chloride: 102 mmol/L (ref 96–106)
Creatinine, Ser: 0.85 mg/dL (ref 0.57–1.00)
Globulin, Total: 2.7 g/dL (ref 1.5–4.5)
Glucose: 91 mg/dL (ref 70–99)
Potassium: 3.9 mmol/L (ref 3.5–5.2)
Sodium: 139 mmol/L (ref 134–144)
Total Protein: 7 g/dL (ref 6.0–8.5)
eGFR: 84 mL/min/{1.73_m2} (ref >59)

## 2022-04-03 LAB — LIPID PANEL
Chol/HDL Ratio: 2.4 ratio (ref 0.0–4.4)
Cholesterol, Total: 137 mg/dL (ref 100–199)
HDL: 58 mg/dL (ref >39)
LDL Chol Calc (NIH): 66 mg/dL (ref 0–99)
Triglycerides: 60 mg/dL (ref 0–149)
VLDL Cholesterol Cal: 13 mg/dL (ref 5–40)

## 2022-04-03 LAB — CBC
Hematocrit: 41.3 % (ref 34.0–46.6)
Hemoglobin: 14.5 g/dL (ref 11.1–15.9)
MCH: 30.5 pg (ref 26.6–33.0)
MCHC: 35.1 g/dL (ref 31.5–35.7)
MCV: 87 fL (ref 79–97)
Platelets: 188 10*3/uL (ref 150–450)
RBC: 4.76 x10E6/uL (ref 3.77–5.28)
RDW: 12.1 % (ref 11.7–15.4)
WBC: 6.2 10*3/uL (ref 3.4–10.8)

## 2022-04-03 LAB — VITAMIN D 25 HYDROXY (VIT D DEFICIENCY, FRACTURES): Vit D, 25-Hydroxy: 35.3 ng/mL (ref 30.0–100.0)

## 2022-04-03 LAB — HEMOGLOBIN A1C
Est. average glucose Bld gHb Est-mCnc: 103 mg/dL
Hgb A1c MFr Bld: 5.2 % (ref 4.8–5.6)

## 2022-04-03 LAB — TSH: TSH: 1.55 u[IU]/mL (ref 0.450–4.500)

## 2022-04-03 LAB — FOLLICLE STIMULATING HORMONE: FSH: 3.4 m[IU]/mL

## 2022-04-05 LAB — CYTOLOGY - PAP
Comment: NEGATIVE
Diagnosis: NEGATIVE
High risk HPV: NEGATIVE

## 2022-04-30 ENCOUNTER — Telehealth: Payer: 59 | Admitting: Women's Health

## 2022-05-02 ENCOUNTER — Other Ambulatory Visit: Payer: Self-pay | Admitting: Gastroenterology

## 2022-06-08 DIAGNOSIS — M5126 Other intervertebral disc displacement, lumbar region: Secondary | ICD-10-CM | POA: Diagnosis not present

## 2022-06-08 DIAGNOSIS — M549 Dorsalgia, unspecified: Secondary | ICD-10-CM | POA: Diagnosis not present

## 2022-06-08 DIAGNOSIS — M545 Low back pain, unspecified: Secondary | ICD-10-CM | POA: Diagnosis not present

## 2022-06-08 DIAGNOSIS — M5137 Other intervertebral disc degeneration, lumbosacral region: Secondary | ICD-10-CM | POA: Diagnosis not present

## 2022-06-08 DIAGNOSIS — Z6822 Body mass index (BMI) 22.0-22.9, adult: Secondary | ICD-10-CM | POA: Diagnosis not present

## 2022-06-25 ENCOUNTER — Other Ambulatory Visit (HOSPITAL_BASED_OUTPATIENT_CLINIC_OR_DEPARTMENT_OTHER): Payer: Self-pay | Admitting: Neurosurgery

## 2022-06-25 DIAGNOSIS — M47816 Spondylosis without myelopathy or radiculopathy, lumbar region: Secondary | ICD-10-CM

## 2022-07-03 ENCOUNTER — Other Ambulatory Visit (HOSPITAL_BASED_OUTPATIENT_CLINIC_OR_DEPARTMENT_OTHER): Payer: 59

## 2022-07-03 ENCOUNTER — Telehealth (HOSPITAL_BASED_OUTPATIENT_CLINIC_OR_DEPARTMENT_OTHER): Payer: Self-pay

## 2022-07-03 ENCOUNTER — Ambulatory Visit (HOSPITAL_BASED_OUTPATIENT_CLINIC_OR_DEPARTMENT_OTHER): Payer: 59

## 2022-07-04 ENCOUNTER — Telehealth (HOSPITAL_BASED_OUTPATIENT_CLINIC_OR_DEPARTMENT_OTHER): Payer: Self-pay

## 2022-07-06 ENCOUNTER — Ambulatory Visit (HOSPITAL_BASED_OUTPATIENT_CLINIC_OR_DEPARTMENT_OTHER): Payer: 59

## 2022-08-26 ENCOUNTER — Other Ambulatory Visit: Payer: Self-pay | Admitting: Gastroenterology

## 2022-08-26 NOTE — Telephone Encounter (Signed)
Pt wants refill on her Pantoprazole SOD DR 40 mg tab. Her last ov was 03/21/2021 with you. Please advise

## 2022-08-27 NOTE — Telephone Encounter (Signed)
Phoned and LMOVM for the pt to return call 

## 2022-08-29 NOTE — Telephone Encounter (Signed)
Phoned and LMOVM regarding pt's Rx being sent in with 5 additional refills. Advised to call office if she has any questions.

## 2022-09-18 ENCOUNTER — Ambulatory Visit (INDEPENDENT_AMBULATORY_CARE_PROVIDER_SITE_OTHER): Payer: 59 | Admitting: Obstetrics and Gynecology

## 2022-09-18 ENCOUNTER — Encounter: Payer: Self-pay | Admitting: Obstetrics and Gynecology

## 2022-09-18 VITALS — BP 107/67 | HR 67 | Ht 67.5 in | Wt 158.0 lb

## 2022-09-18 DIAGNOSIS — K5902 Outlet dysfunction constipation: Secondary | ICD-10-CM

## 2022-09-18 DIAGNOSIS — N393 Stress incontinence (female) (male): Secondary | ICD-10-CM

## 2022-09-18 DIAGNOSIS — R350 Frequency of micturition: Secondary | ICD-10-CM | POA: Diagnosis not present

## 2022-09-18 DIAGNOSIS — R159 Full incontinence of feces: Secondary | ICD-10-CM | POA: Diagnosis not present

## 2022-09-18 DIAGNOSIS — N3281 Overactive bladder: Secondary | ICD-10-CM | POA: Diagnosis not present

## 2022-09-18 DIAGNOSIS — K5904 Chronic idiopathic constipation: Secondary | ICD-10-CM | POA: Diagnosis not present

## 2022-09-18 LAB — POCT URINALYSIS DIPSTICK
Bilirubin, UA: NEGATIVE
Glucose, UA: NEGATIVE
Ketones, UA: NEGATIVE
Leukocytes, UA: NEGATIVE
Nitrite, UA: NEGATIVE
Protein, UA: NEGATIVE
Spec Grav, UA: 1.02 (ref 1.010–1.025)
Urobilinogen, UA: 0.2 E.U./dL
pH, UA: 7.5 (ref 5.0–8.0)

## 2022-09-18 MED ORDER — MIRABEGRON ER 50 MG PO TB24: 50.0000 mg | ORAL_TABLET | Freq: Every day | ORAL | 5 refills | Status: DC

## 2022-09-18 NOTE — Patient Instructions (Addendum)
Today we talked about ways to manage bladder urgency such as altering your diet to avoid irritative beverages and foods (bladder diet) as well as attempting to decrease stress and other exacerbating factors.    The Most Bothersome Foods* The Least Bothersome Foods*  Coffee - Regular & Decaf Tea - caffeinated Carbonated beverages - cola, non-colas, diet & caffeine-free Alcohols - Beer, Red Wine, White Wine, Champagne Fruits - Grapefruit, Lemon, Orange, Pineapple Fruit Juices - Cranberry, Grapefruit, Orange, Pineapple Vegetables - Tomato & Tomato Products Flavor Enhancers - Hot peppers, Spicy foods, Chili, Horseradish, Vinegar, Monosodium glutamate (MSG) Artificial Sweeteners - NutraSweet, Sweet 'N Low, Equal (sweetener), Saccharin Ethnic foods - Mexican, Thai, Indian food Water Milk - low-fat & whole Fruits - Bananas, Blueberries, Honeydew melon, Pears, Raisins, Watermelon Vegetables - Broccoli, Brussels Sprouts, Cabbage, Carrots, Cauliflower, Celery, Cucumber, Mushrooms, Peas, Radishes, Squash, Zucchini, White potatoes, Sweet potatoes & yams Poultry - Chicken, Eggs, Turkey, Meat - Beef, Pork, Lamb Seafood - Shrimp, Tuna fish, Salmon Grains - Oat, Rice Snacks - Pretzels, Popcorn  *Friedlander J. et al. Diet and its role in interstitial cystitis/bladder pain syndrome (IC/BPS) and comorbid conditions. [employer]. [employer]U Int. 2012 Jan 11.   Constipation: Our goal is to achieve formed bowel movements daily or every-other-day.  You may need to try different combinations of the following options to find what works best for you - everybody's body works differently so feel free to adjust the dosages as needed.  Some options to help maintain bowel health include:  Dietary changes (more leafy greens, vegetables and fruits; less processed foods) Fiber supplementation (Benefiber, FiberCon, Metamucil or Psyllium). Start slow and increase gradually to full dose. Over-the-counter agents such as: stool  softeners (Docusate or Colace) and/or laxatives (Miralax, milk of magnesia)  "Power Pudding" is a natural mixture that may help your constipation.  To make blend 1 cup applesauce, 1 cup wheat bran, and 3/4 cup prune juice, refrigerate and then take 1 tablespoon daily with a large glass of water as needed.  

## 2022-09-18 NOTE — Progress Notes (Signed)
[facility] New Patient Evaluation and Consultation  Referring Provider: Roma Schanz, CNM PCP: Caryl Bis, MD Date of Service: 09/18/2022  SUBJECTIVE Chief Complaint: New Patient (Initial Visit)  History of Present Illness: Kayla White is a 51 y.o. White or Caucasian female seen in consultation at the request of CNM Knute Neu for evaluation of incontinence.    Review of records significant for: Has cystocele and leaks with cough and sensse. Has tried kegels.   Urinary Symptoms: Leaks urine with cough/ sneeze, laughing, exercise, lifting, with a full bladder, with movement to the bathroom, and with urgency. UUI = SUI.  Leaks 2-3 time(s) per day.  Pad use: 2- 3 liners/ mini-pads per day.   She is bothered by her UI symptoms. Has been happening for about 14 years. Has tried medications: oxybutynin and vesicare. These did not work and dried her out too much.  Has also done kegel exercises.   Day time voids 5-6.  Nocturia: 1+ times per night to void. Voiding dysfunction: she empties her bladder well.  does not use a catheter to empty bladder.  When urinating, she feels dribbling after finishing Drinks: 5- 16oz glasses water, with sugar free packets  UTIs:  0  UTI's in the last year.   Denies history of blood in urine and kidney or bladder stones  Pelvic Organ Prolapse Symptoms:                  She is not sure if she has bulge the vaginal area.    Bowel Symptom: Bowel movements: varies, about 3 times per week Stool consistency: hard- but varies Straining: tries not to strain.  Splinting: no.  Incomplete evacuation: yes.  She Admits to accidental bowel leakage / fecal incontinence  Occurs: usually happens with exercise or squatting, some type of movement  Consistency with leakage: solid Bowel regimen: glycerin suppositories when needed, stool softener and miralax- not consistent. Has been on amitiza and linzess previously. Has tried  different fiber supplements.  Last colonoscopy: Date 05/2021  Sexual Function Sexually active: yes.  Sexual orientation:  heterosexual Pain with sex: sometimes has discomfort due to dryness Has minimal sensation with intercourse.  Pelvic Pain Denies pelvic pain    Past Medical History:  Past Medical History:  Diagnosis Date   Anxiety    Depression    Eosinophilic esophagitis    GERD (gastroesophageal reflux disease)    IBS (irritable bowel syndrome)    Migraines    PONV (postoperative nausea and vomiting)      Past Surgical History:   Past Surgical History:  Procedure Laterality Date   BIOPSY  10/19/2018   Procedure: BIOPSY;  Surgeon: Daneil Dolin, MD;  Location: [facility];  Service: Endoscopy;;  gastric   CHOLECYSTECTOMY N/A 08/14/2018   Procedure: LAPAROSCOPIC CHOLECYSTECTOMY;  Surgeon: Aviva Signs, MD;  Location: [facility];  Service: General;  Laterality: N/A;   COLONOSCOPY WITH ESOPHAGOGASTRODUODENOSCOPY (EGD)  06/2018   Dr. Anthony Sar: EGD "normal".  Colonoscopy with hemorrhoids.   COLONOSCOPY WITH PROPOFOL N/A 06/11/2021   Procedure: COLONOSCOPY WITH PROPOFOL;  Surgeon: Daneil Dolin, MD;  Location: [facility];  Service: Endoscopy;  Laterality: N/A;  2:45pm   DILATION AND CURETTAGE OF UTERUS  2009   ESOPHAGEAL DILATION  06/11/2021   Procedure: ESOPHAGEAL DILATION;  Surgeon: Daneil Dolin, MD;  Location: [facility];  Service: Endoscopy;;   ESOPHAGOGASTRODUODENOSCOPY (EGD) WITH PROPOFOL N/A 10/19/2018   Dr. Gala Romney: Mild Schatzki ring found at  GEJ with tiny erosions.  Mild longitudinal furrows of the tubular esophagus.  Small hiatal hernia.  Multiple erosions in the stomach.  Diffuse erythematous mucosa found in the entire stomach.  Esophagus dilated up to 74 [place].  Gastric biopsy mild reactive gastropathy, mild chronic gastritis. BX esophagitis with up to 30/HPF intraepithelial EOS   ESOPHAGOGASTRODUODENOSCOPY (EGD) WITH PROPOFOL N/A 06/11/2021   Procedure:  ESOPHAGOGASTRODUODENOSCOPY (EGD) WITH PROPOFOL;  Surgeon: Daneil Dolin, MD;  Location: [facility];  Service: Endoscopy;  Laterality: N/A;   IVF  2007, 099 amd 2010   LAPAROSCOPIC ABDOMINAL EXPLORATION     R/o endometriosis   MALONEY DILATION N/A 10/19/2018   Procedure: Venia Minks DILATION;  Surgeon: Daneil Dolin, MD;  Location: [facility];  Service: Endoscopy;  Laterality: N/A;     Past OB/GYN History: OB History  Gravida Para Term Preterm AB Living  3 2   2 1 4   SAB IAB Ectopic Multiple Live Births  1     2 4     # Outcome Date GA Lbr Len/2nd Weight Sex Delivery Anes PTL Lv  3A Preterm 11/02/09    M Vag-Spont   LIV  3B Preterm     F Vag-Spont   LIV  2 SAB 06/2008          1A Preterm 09/02/06    M Vag-Forceps   LIV  1B Preterm     F Vag-Spont   LIV   Patient's last menstrual period was 09/18/2022. Contraception: vasectomy. Last pap smear was 03/2022- negative.     Medications: She has a current medication list which includes the following prescription(s): acetaminophen, calcium carbonate, halobetasol, mirabegron er, nutritional supplements, omeprazole, sertraline, and turmeric.   Allergies: Patient is allergic to bee venom, eggs or egg-derived products, shellfish allergy, and penicillins.   Social History:  Social History   Tobacco Use   Smoking status: Never   Smokeless tobacco: Never  Vaping Use   Vaping Use: Never used  Substance Use Topics   Alcohol use: No    Alcohol/week: 0.0 standard drinks of alcohol   Drug use: No    Relationship status: married She lives with husband and children.   She is employed as a Cabin crew. Regular exercise: No History of abuse: No  Family History:   Family History  Problem Relation Age of Onset   Congestive Heart Failure Father    Dementia Father    Thyroid disease Father    Arthritis Mother    Hypertension Mother    Alzheimer's disease Paternal Grandmother    Cancer Paternal Grandfather    Cancer Maternal  Grandfather    Anxiety disorder Son    Depression Son    OCD Son    Autism Son    Hearing loss Son        has hearing aid   Colon cancer Neg Hx    Colon polyps Neg Hx      Review of Systems: Review of Systems  Constitutional:  Negative for fever, malaise/fatigue and weight loss.  Respiratory:  Negative for cough, shortness of breath and wheezing.   Cardiovascular:  Negative for chest pain, palpitations and leg swelling.  Gastrointestinal:  Negative for abdominal pain and blood in stool.  Genitourinary:  Negative for dysuria.  Musculoskeletal:  Positive for myalgias.  Skin:  Negative for rash.  Neurological:  Positive for headaches. Negative for dizziness.  Endo/Heme/Allergies:  Does not bruise/bleed easily.  Psychiatric/Behavioral:  Positive for depression. The patient is not nervous/anxious.  OBJECTIVE Physical Exam: Vitals:   09/18/22 1312  BP: 107/67  Pulse: 67  Weight: 158 lb (71.7 kg)  Height: 5' 7.5" (1.715 m)    Physical Exam Constitutional:      General: She is not in acute distress. Pulmonary:     Effort: Pulmonary effort is normal.  Abdominal:     General: There is no distension.     Palpations: Abdomen is soft.     Tenderness: There is no abdominal tenderness. There is no rebound.  Musculoskeletal:        General: No swelling. Normal range of motion.  Skin:    General: Skin is warm and dry.     Findings: No rash.  Neurological:     Mental Status: She is alert and oriented to person, place, and time.  Psychiatric:        Mood and Affect: Mood normal.        Behavior: Behavior normal.      GU / Detailed Urogynecologic Evaluation:  Pelvic Exam: Normal external female genitalia; Bartholin's and Skene's glands normal in appearance; urethral meatus normal in appearance, no urethral masses or discharge.   CST: negative  Speculum exam reveals normal vaginal mucosa without atrophy. Cervix normal appearance. Uterus normal single, nontender. Adnexa  no mass, fullness, tenderness.     Pelvic floor strength I/V, puborectalis II/V external anal sphincter III/V  Pelvic floor musculature: Right levator non-tender, Right obturator non-tender, Left levator non-tender, Left obturator non-tender  POP-Q:   POP-Q  -2                                            Aa   -2                                           Ba  -5.5                                              C   3                                            Gh  4                                            Pb  8.5                                            tvl   -1                                            Ap  -1  Bp  -7.5                                              D      Rectal Exam:  Normal sphincter tone, small distal rectocele, enterocoele not present, no rectal masses, noted dyssynergia when asking the patient to bear down.  Post-Void Residual (PVR) by Bladder Scan: In order to evaluate bladder emptying, we discussed obtaining a postvoid residual and she agreed to this procedure.  Procedure: The ultrasound unit was placed on the patient's abdomen in the suprapubic region after the patient had voided. A PVR of 23 ml was obtained by bladder scan.  Laboratory Results: POC urine: large blood, but has vaginal spotting today   ASSESSMENT AND PLAN [name] is a 51 y.o. with:  1. Overactive bladder   2. Urinary frequency   3. SUI (stress urinary incontinence, female)   4. Rectosphincteric dyssynergia   5. Chronic idiopathic constipation   6. Incontinence of feces, unspecified fecal incontinence type    OAB - We discussed the symptoms of overactive bladder (OAB), which include urinary urgency, urinary frequency, nocturia, with or without urge incontinence.  While we do not know the exact etiology of OAB, several treatment options exist. We discussed management including behavioral therapy (decreasing bladder  irritants, urge suppression strategies, timed voids, bladder retraining), physical therapy, medication; for refractory cases posterior tibial nerve stimulation, sacral neuromodulation, and intravesical botulinum toxin injection.  - Prescribed Myrbetriq 50mg  daily. Samples provided. For Beta-3 agonist medication, we discussed the potential side effect of elevated blood pressure which is more likely to occur in individuals with uncontrolled hypertension. - referral also placed to pelvic PT  2. SUI - For treatment of stress urinary incontinence,  non-surgical options include expectant management, weight loss, physical therapy, as well as a pessary.  Surgical options include a midurethral sling, Burch urethropexy, and transurethral injection of a bulking agent. - She will start with pelvic PT  3. Constipation/ dyssynergia/ fecal incontinence - Discussed trying to be more consistent with miralax and stool softener use to prevent constipation.  - Noted dyssynergia on exam which can also be contributing to her constipation. Recommended pelvic PT.  - For fecal incontinence, will work on pelvic floor muscle strengthening with PT, improving stool consistency. Also discussed option of sacral neuromodulation.   Return 6 weeks   Kayla Folds, MD

## 2022-09-27 ENCOUNTER — Ambulatory Visit (HOSPITAL_BASED_OUTPATIENT_CLINIC_OR_DEPARTMENT_OTHER)
Admission: RE | Admit: 2022-09-27 | Discharge: 2022-09-27 | Disposition: A | Discharge status: Home / Self Care | Payer: 59 | Source: Ambulatory Visit | Attending: Neurosurgery | Admitting: Neurosurgery

## 2022-09-27 DIAGNOSIS — M5137 Other intervertebral disc degeneration, lumbosacral region: Secondary | ICD-10-CM | POA: Diagnosis not present

## 2022-09-27 DIAGNOSIS — M48061 Spinal stenosis, lumbar region without neurogenic claudication: Secondary | ICD-10-CM | POA: Insufficient documentation

## 2022-09-27 DIAGNOSIS — M47816 Spondylosis without myelopathy or radiculopathy, lumbar region: Secondary | ICD-10-CM | POA: Diagnosis not present

## 2022-09-27 DIAGNOSIS — M5127 Other intervertebral disc displacement, lumbosacral region: Secondary | ICD-10-CM | POA: Insufficient documentation

## 2022-09-27 DIAGNOSIS — M5126 Other intervertebral disc displacement, lumbar region: Secondary | ICD-10-CM | POA: Diagnosis not present

## 2022-10-24 DIAGNOSIS — R059 Cough, unspecified: Secondary | ICD-10-CM | POA: Diagnosis not present

## 2022-10-24 DIAGNOSIS — R030 Elevated blood-pressure reading, without diagnosis of hypertension: Secondary | ICD-10-CM | POA: Diagnosis not present

## 2022-10-24 DIAGNOSIS — Z20828 Contact with and (suspected) exposure to other viral communicable diseases: Secondary | ICD-10-CM | POA: Diagnosis not present

## 2022-10-24 DIAGNOSIS — Z6824 Body mass index (BMI) 24.0-24.9, adult: Secondary | ICD-10-CM | POA: Diagnosis not present

## 2022-10-30 ENCOUNTER — Ambulatory Visit: Payer: 59 | Admitting: Obstetrics and Gynecology

## 2022-12-04 DIAGNOSIS — M47816 Spondylosis without myelopathy or radiculopathy, lumbar region: Secondary | ICD-10-CM | POA: Diagnosis not present

## 2022-12-04 DIAGNOSIS — M5441 Lumbago with sciatica, right side: Secondary | ICD-10-CM | POA: Diagnosis not present

## 2022-12-11 DIAGNOSIS — M519 Unspecified thoracic, thoracolumbar and lumbosacral intervertebral disc disorder: Secondary | ICD-10-CM | POA: Diagnosis not present

## 2022-12-11 DIAGNOSIS — M5416 Radiculopathy, lumbar region: Secondary | ICD-10-CM | POA: Diagnosis not present

## 2022-12-11 DIAGNOSIS — M47816 Spondylosis without myelopathy or radiculopathy, lumbar region: Secondary | ICD-10-CM | POA: Diagnosis not present

## 2022-12-11 DIAGNOSIS — M5441 Lumbago with sciatica, right side: Secondary | ICD-10-CM | POA: Diagnosis not present

## 2022-12-18 DIAGNOSIS — M5441 Lumbago with sciatica, right side: Secondary | ICD-10-CM | POA: Diagnosis not present

## 2022-12-18 DIAGNOSIS — M47816 Spondylosis without myelopathy or radiculopathy, lumbar region: Secondary | ICD-10-CM | POA: Diagnosis not present

## 2022-12-26 DIAGNOSIS — M5441 Lumbago with sciatica, right side: Secondary | ICD-10-CM | POA: Diagnosis not present

## 2022-12-26 DIAGNOSIS — M47816 Spondylosis without myelopathy or radiculopathy, lumbar region: Secondary | ICD-10-CM | POA: Diagnosis not present

## 2022-12-30 ENCOUNTER — Other Ambulatory Visit (HOSPITAL_COMMUNITY): Payer: Self-pay | Admitting: Obstetrics & Gynecology

## 2022-12-30 DIAGNOSIS — Z1231 Encounter for screening mammogram for malignant neoplasm of breast: Secondary | ICD-10-CM

## 2023-01-06 ENCOUNTER — Ambulatory Visit (HOSPITAL_COMMUNITY)
Admission: RE | Admit: 2023-01-06 | Discharge: 2023-01-06 | Disposition: A | Discharge status: Home / Self Care | Payer: 59 | Source: Ambulatory Visit | Attending: Obstetrics & Gynecology | Admitting: Obstetrics & Gynecology

## 2023-01-06 DIAGNOSIS — Z1231 Encounter for screening mammogram for malignant neoplasm of breast: Secondary | ICD-10-CM

## 2023-01-15 ENCOUNTER — Telehealth: Payer: Self-pay

## 2023-01-15 ENCOUNTER — Encounter: Payer: Self-pay | Admitting: Obstetrics and Gynecology

## 2023-01-15 DIAGNOSIS — N3281 Overactive bladder: Secondary | ICD-10-CM

## 2023-01-15 DIAGNOSIS — M5441 Lumbago with sciatica, right side: Secondary | ICD-10-CM | POA: Diagnosis not present

## 2023-01-15 DIAGNOSIS — M47816 Spondylosis without myelopathy or radiculopathy, lumbar region: Secondary | ICD-10-CM | POA: Diagnosis not present

## 2023-01-15 MED ORDER — MIRABEGRON ER 50 MG PO TB24: 50.0000 mg | ORAL_TABLET | Freq: Every day | ORAL | 5 refills | Status: DC

## 2023-01-15 NOTE — Telephone Encounter (Signed)
Patient requested medication refill for Myrbetriq. Medication has been sent to her pharmacy.

## 2023-01-16 MED ORDER — MIRABEGRON ER 50 MG PO TB24: 50.0000 mg | ORAL_TABLET | Freq: Every day | ORAL | 0 refills | Status: DC

## 2023-01-16 NOTE — Addendum Note (Signed)
Addended by: Jaquita Folds on: 01/16/2023 10:24 AM   Modules accepted: Orders

## 2023-01-25 IMAGING — MG MM DIGITAL SCREENING BILAT W/ TOMO AND CAD
8 series · 8 of 24 positions shown · non-contrast
Comparison: Previous exam(s).

CLINICAL DATA: Screening.

EXAM:
DIGITAL SCREENING BILATERAL MAMMOGRAM WITH TOMOSYNTHESIS AND CAD
TECHNIQUE: Bilateral screening digital craniocaudal and mediolateral oblique
mammograms were obtained. Bilateral screening digital breast
tomosynthesis was performed. The images were evaluated with
computer-aided detection.

[L CC synth-2D]
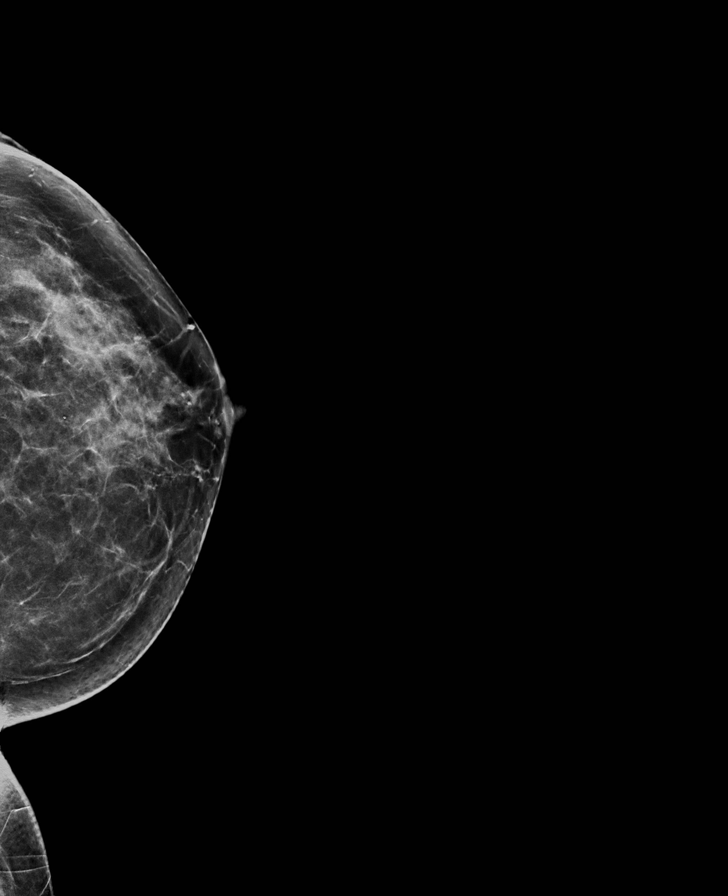

[L MLO synth-2D]
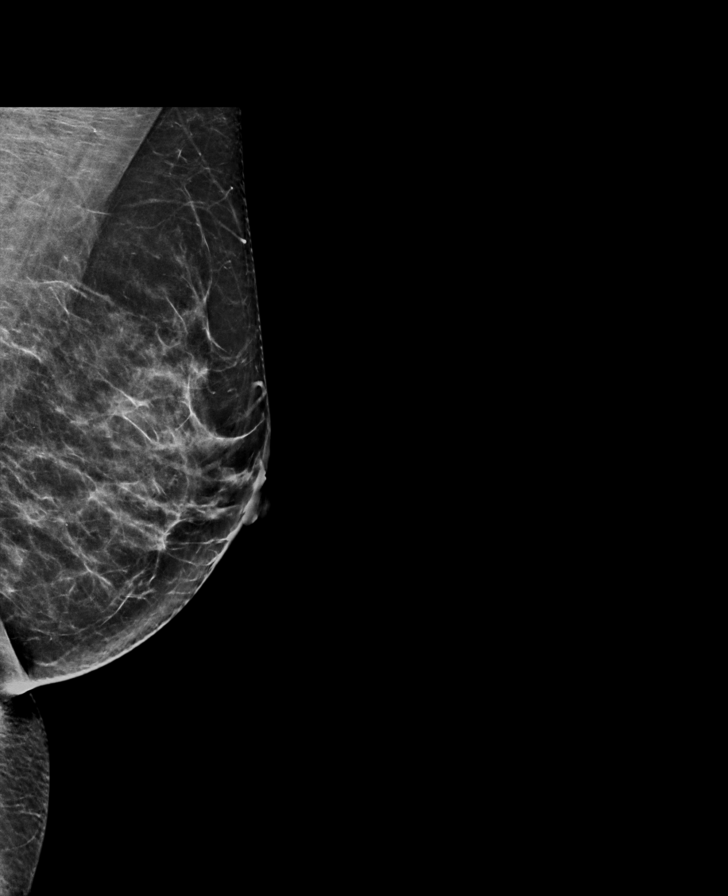

[R MLO synth-2D]
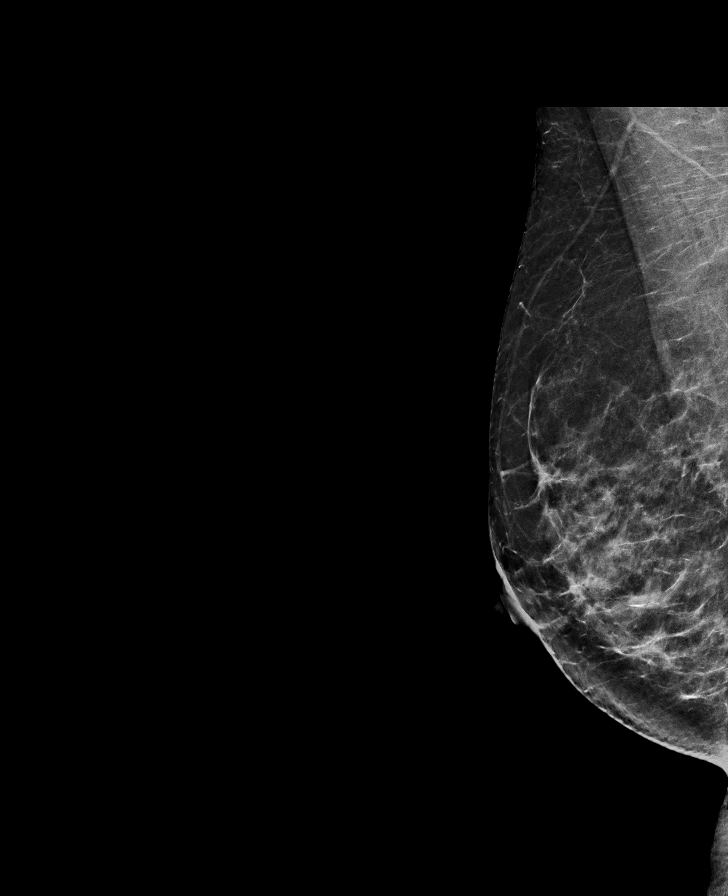

[R CC synth-2D]
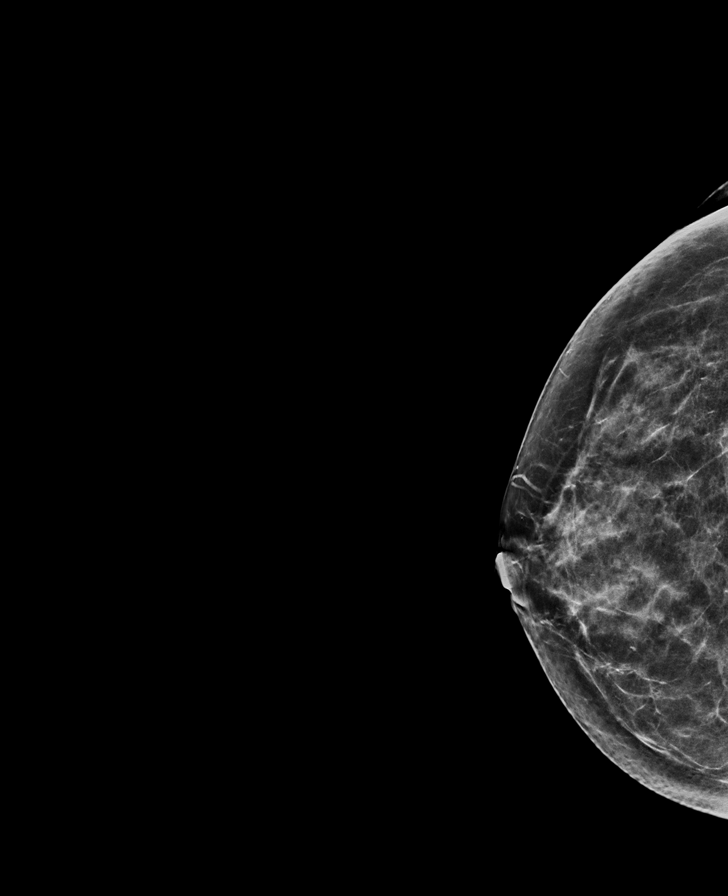

[R MLO tomo · tomo slice 34/67.0]
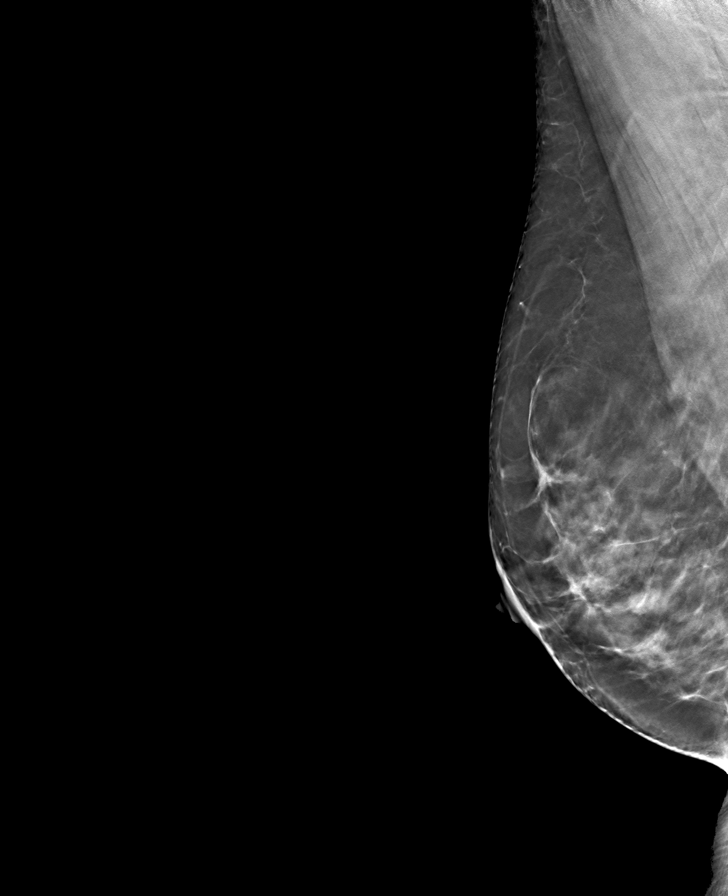

[L CC tomo · tomo slice 35/68.0]
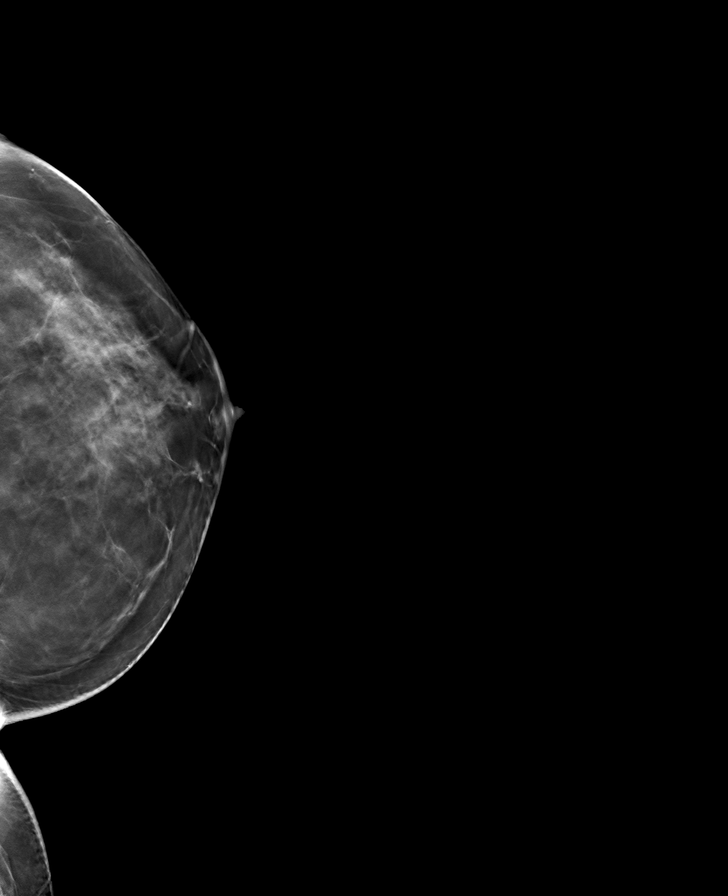

[R CC tomo · tomo slice 34/67.0]
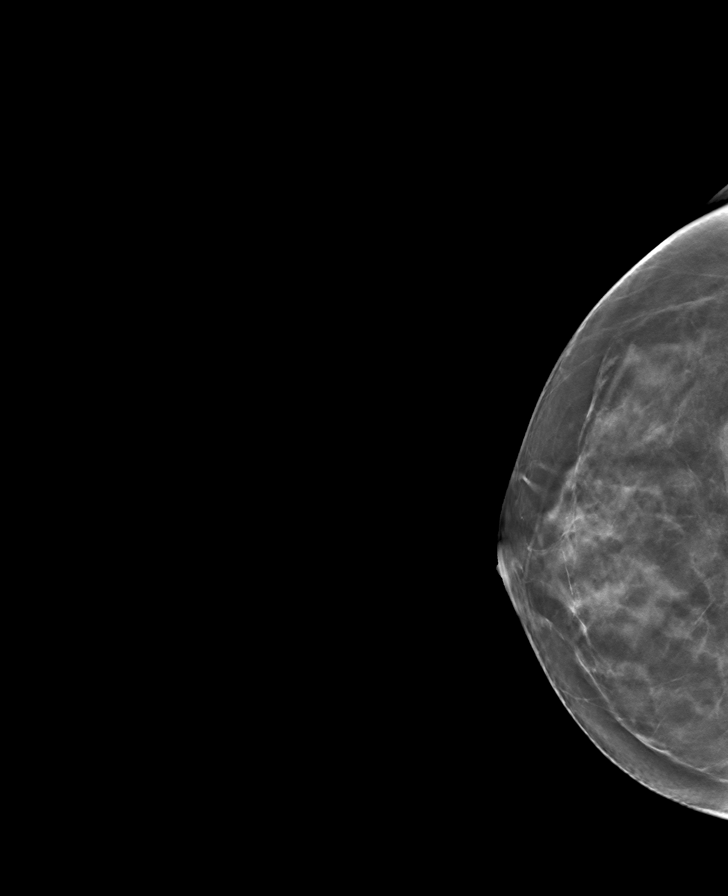

[L MLO tomo · tomo slice 33/66.0]
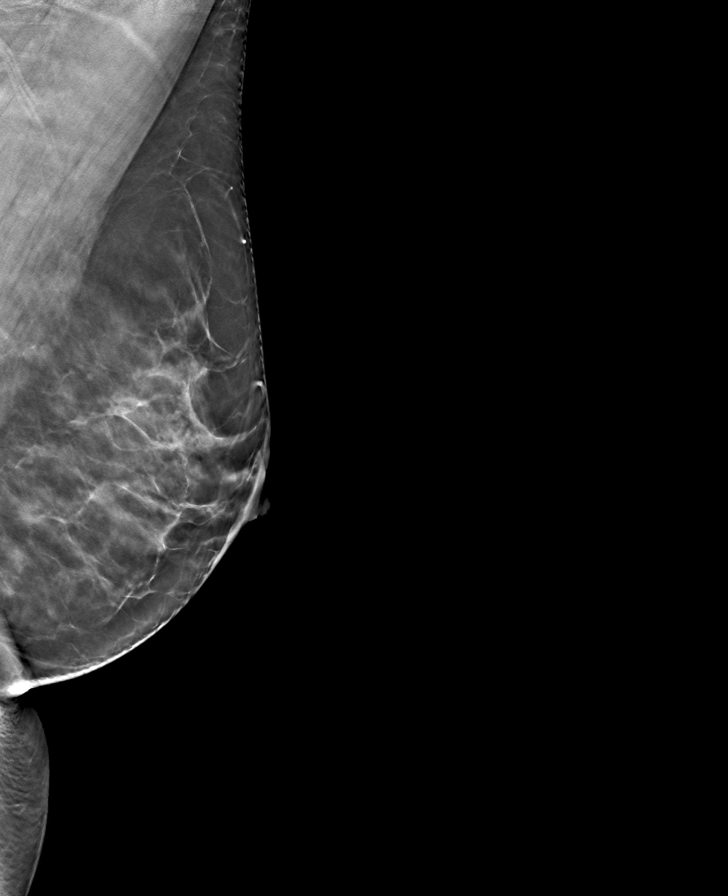

[8 of 24 positions shown; findings below may reference images not displayed]

ACR Breast Density Category c: The breast tissue is heterogeneously
dense, which may obscure small masses.
FINDINGS: There are no findings suspicious for malignancy.
IMPRESSION: No mammographic evidence of malignancy. A result letter of this
screening mammogram will be mailed directly to the patient.

RECOMMENDATION:
Screening mammogram in one year. (Code:Q3-W-BC3)

BI-RADS CATEGORY  1: Negative.

## 2023-03-08 DIAGNOSIS — Z6824 Body mass index (BMI) 24.0-24.9, adult: Secondary | ICD-10-CM | POA: Diagnosis not present

## 2023-03-08 DIAGNOSIS — R030 Elevated blood-pressure reading, without diagnosis of hypertension: Secondary | ICD-10-CM | POA: Diagnosis not present

## 2023-03-08 DIAGNOSIS — K2100 Gastro-esophageal reflux disease with esophagitis, without bleeding: Secondary | ICD-10-CM | POA: Diagnosis not present

## 2023-05-06 ENCOUNTER — Other Ambulatory Visit: Payer: Self-pay | Admitting: Women's Health

## 2023-05-26 ENCOUNTER — Other Ambulatory Visit (HOSPITAL_COMMUNITY)
Admission: RE | Admit: 2023-05-26 | Discharge: 2023-05-26 | Disposition: A | Discharge status: Home / Self Care | Payer: 59 | Source: Ambulatory Visit | Attending: Women's Health | Admitting: Women's Health

## 2023-05-26 ENCOUNTER — Ambulatory Visit (INDEPENDENT_AMBULATORY_CARE_PROVIDER_SITE_OTHER): Payer: 59 | Admitting: Women's Health

## 2023-05-26 ENCOUNTER — Encounter: Payer: Self-pay | Admitting: Women's Health

## 2023-05-26 VITALS — BP 103/62 | HR 72 | Ht 69.0 in | Wt 160.2 lb

## 2023-05-26 DIAGNOSIS — Z01419 Encounter for gynecological examination (general) (routine) without abnormal findings: Secondary | ICD-10-CM

## 2023-05-26 DIAGNOSIS — N951 Menopausal and female climacteric states: Secondary | ICD-10-CM

## 2023-05-26 DIAGNOSIS — R61 Generalized hyperhidrosis: Secondary | ICD-10-CM | POA: Diagnosis not present

## 2023-05-26 DIAGNOSIS — Z1321 Encounter for screening for nutritional disorder: Secondary | ICD-10-CM | POA: Diagnosis not present

## 2023-05-26 DIAGNOSIS — Z131 Encounter for screening for diabetes mellitus: Secondary | ICD-10-CM

## 2023-05-26 DIAGNOSIS — R5383 Other fatigue: Secondary | ICD-10-CM

## 2023-05-26 DIAGNOSIS — Z1329 Encounter for screening for other suspected endocrine disorder: Secondary | ICD-10-CM | POA: Diagnosis not present

## 2023-05-26 NOTE — Progress Notes (Signed)
WELL-WOMAN EXAMINATION Patient name: Kayla White MRN [id number]  Date of birth: [birth year] Chief Complaint:   Gynecologic Exam (Pap/physcial)  History of Present Illness:   Kayla White is a 52 y.o. [phone] Caucasian female being seen today for a routine well-woman exam.  Current complaints: headaches, no energy, joint pain, nauseated, no sex drive, foggy, breasts ache, night sweats, some mild mood swings. 3 periods since Dec- Jan, end of April and short one in June. Friend said estradiol/norethindrone 1mg /0.5mg  helped her. Saw PCP in April, no bloodwork. H/O cystocele and rectocele w/ mixed UI, saw Dr. Florian Buff Oct 2023, felt overwhelmed by options, started Myrbetriq which she had tried before, didn't help, not currently taking. On zoloft for OCD.   PCP: [facility]      does desire labs Patient's last menstrual period was 05/02/2023. The current method of family planning is vasectomy.  Last pap 10/09/20. Results were: NILM w/ HRHPV negative. H/O abnormal pap: no Last mammogram: 01/06/23. Results were: normal. Family h/o breast cancer: no Last colonoscopy: 06/11/21. Results were: normal. Family h/o colorectal cancer: no     05/26/2023   10:27 AM 04/02/2022    3:38 PM  Depression screen PHQ 2/9  Decreased Interest 2 1  Down, Depressed, Hopeless 1 1  PHQ - 2 Score 3 2  Altered sleeping 1 1  Tired, decreased energy 3 1  Change in appetite 1 0  Feeling bad or failure about yourself  0 1  Trouble concentrating 2 1  Moving slowly or fidgety/restless 2 0  Suicidal thoughts 0 0  PHQ-9 Score 12 6        05/26/2023   10:28 AM 04/02/2022    3:38 PM  GAD 7 : Generalized Anxiety Score  Nervous, Anxious, on Edge 1 1  Control/stop worrying 0 1  Worry too much - different things 0 1  Trouble relaxing 1 1  Restless 0 1  Easily annoyed or irritable 2 1  Afraid - awful might happen 1 0  Total GAD 7 Score 5 6     Review of Systems:   Pertinent items are noted in  HPI Denies any headaches, blurred vision, fatigue, shortness of breath, chest pain, abdominal pain, abnormal vaginal discharge/itching/odor/irritation, problems with periods, bowel movements, urination, or intercourse unless otherwise stated above. Pertinent History Reviewed:  Reviewed past medical,surgical, social and family history.  Reviewed problem list, medications and allergies. Physical Assessment:   Vitals:   05/26/23 1014  BP: 103/62  Pulse: 72  Weight: 160 lb 3.2 oz (72.7 kg)  Height: 5\' 9"  (1.753 m)  Body mass index is 23.66 kg/m.        Physical Examination:   General appearance - well appearing, and in no distress  Mental status - alert, oriented to person, place, and time  Psych:  She has a normal mood and affect  Skin - warm and dry, normal color, no suspicious lesions noted  Chest - effort normal, all lung fields clear to auscultation bilaterally  Heart - normal rate and regular rhythm  Neck:  midline trachea, no thyromegaly or nodules  Breasts - breasts appear normal, no suspicious masses, no skin or nipple changes or  axillary nodes  Abdomen - soft, nontender, nondistended, no masses or organomegaly  Pelvic - VULVA: normal appearing vulva with no masses, tenderness or lesions  VAGINA: normal appearing vagina with normal color and discharge, no lesions, +cystocele  CERVIX: normal appearing cervix without discharge or lesions, no CMT  Thin prep pap is done  w/ HR HPV cotesting  UTERUS: uterus is felt to be normal size, shape, consistency and nontender   ADNEXA: No adnexal masses or tenderness noted.  Extremities:  No swelling or varicosities noted  Chaperone: Peggy Dones    No results found for this or any previous visit (from the past 24 hour(s)).  Assessment & Plan:  1) Well-Woman Exam  2) Perimenopausal> 3 periods since Dec, multiple symptoms- discussed some of which may not be r/t perimenopause, recommend she also speak w/ PCP. Will check labs and discuss  options when they result  3) Cystocele and rectocele w/ mixed UI> can call to make another appt w/ Dr. Florian Buff to discuss options again  Labs/procedures today: as below  Mammogram:  Feb 2025 , or sooner if problems Colonoscopy: per GI, or sooner if problems  Orders Placed This Encounter  Procedures   CBC   Comprehensive metabolic panel   TSH   Hemoglobin A1c   VITAMIN D 25 Hydroxy (Vit-D Deficiency, Fractures)   Follicle stimulating hormone    Meds: No orders of the defined types were placed in this encounter.   Follow-up: Return in about 1 year (around 05/25/2024) for Physical.  Cheral Marker CNM, WHNP-BC 05/26/2023 11:54 AM

## 2023-05-27 ENCOUNTER — Encounter: Payer: Self-pay | Admitting: Women's Health

## 2023-05-27 ENCOUNTER — Ambulatory Visit: Payer: 59 | Admitting: Internal Medicine

## 2023-05-27 ENCOUNTER — Encounter: Payer: Self-pay | Admitting: Internal Medicine

## 2023-05-27 VITALS — BP 109/69 | HR 66 | Temp 98.5°F | Ht 69.0 in | Wt 160.8 lb

## 2023-05-27 DIAGNOSIS — K5909 Other constipation: Secondary | ICD-10-CM | POA: Diagnosis not present

## 2023-05-27 DIAGNOSIS — K219 Gastro-esophageal reflux disease without esophagitis: Secondary | ICD-10-CM | POA: Diagnosis not present

## 2023-05-27 LAB — COMPREHENSIVE METABOLIC PANEL
ALT: 14 IU/L (ref 0–32)
AST: 19 IU/L (ref 0–40)
Albumin: 4.2 g/dL (ref 3.8–4.9)
Alkaline Phosphatase: 53 IU/L (ref 44–121)
BUN/Creatinine Ratio: 17 (ref 9–23)
BUN: 15 mg/dL (ref 6–24)
Bilirubin Total: 0.4 mg/dL (ref 0.0–1.2)
CO2: 22 mmol/L (ref 20–29)
Calcium: 9 mg/dL (ref 8.7–10.2)
Chloride: 103 mmol/L (ref 96–106)
Creatinine, Ser: 0.89 mg/dL (ref 0.57–1.00)
Globulin, Total: 2 g/dL (ref 1.5–4.5)
Glucose: 82 mg/dL (ref 70–99)
Potassium: 4.5 mmol/L (ref 3.5–5.2)
Sodium: 138 mmol/L (ref 134–144)
Total Protein: 6.2 g/dL (ref 6.0–8.5)
eGFR: 78 mL/min/{1.73_m2} (ref >59)

## 2023-05-27 LAB — CBC
Hematocrit: 40.8 % (ref 34.0–46.6)
Hemoglobin: 13.4 g/dL (ref 11.1–15.9)
MCH: 28.9 pg (ref 26.6–33.0)
MCHC: 32.8 g/dL (ref 31.5–35.7)
MCV: 88 fL (ref 79–97)
Platelets: 170 10*3/uL (ref 150–450)
RBC: 4.64 x10E6/uL (ref 3.77–5.28)
RDW: 12.8 % (ref 11.7–15.4)
WBC: 5.2 10*3/uL (ref 3.4–10.8)

## 2023-05-27 LAB — CYTOLOGY - PAP
Comment: NEGATIVE
Diagnosis: NEGATIVE
High risk HPV: NEGATIVE

## 2023-05-27 LAB — HEMOGLOBIN A1C
Est. average glucose Bld gHb Est-mCnc: 108 mg/dL
Hgb A1c MFr Bld: 5.4 % (ref 4.8–5.6)

## 2023-05-27 LAB — FOLLICLE STIMULATING HORMONE: FSH: 4.5 m[IU]/mL

## 2023-05-27 LAB — VITAMIN D 25 HYDROXY (VIT D DEFICIENCY, FRACTURES): Vit D, 25-Hydroxy: 22.8 ng/mL — ABNORMAL LOW (ref 30.0–100.0)

## 2023-05-27 LAB — TSH: TSH: 2.15 u[IU]/mL (ref 0.450–4.500)

## 2023-05-27 NOTE — Progress Notes (Unsigned)
Primary Care Physician:  Richardean Chimera, MD Primary Gastroenterologist:  Dr. Jena Gauss  Pre-Procedure History & Physical: HPI:  Kayla White is a 52 y.o. female here for worsening reflux symptoms nocturnal reflux symptoms not having any dysphagia since having her Schatzki's ring dilated.  Prior biopsies suggestive of EOE on certain specimens.  She tolerated a large more dilation previously.  Does not take her reflux medicine on a regular basis (Protonix).  Given Carafate tablets recently for symptoms did not help.  Longstanding constipation in the context of IBS-C.  Took Linzess 145 previously on Amitiza.  Because diarrhea stopped them.  Takes over-the-counter agents but waits till she has not had a bowel movement in 3 to 4 days before taking some point.  Negative colonoscopy 2019.  Does complain of bloating after drinking sweetened beverages.  Past Medical History:  Diagnosis Date   Anxiety    Depression    Eosinophilic esophagitis    GERD (gastroesophageal reflux disease)    IBS (irritable bowel syndrome)    Migraines    PONV (postoperative nausea and vomiting)     Past Surgical History:  Procedure Laterality Date   BIOPSY  10/19/2018   Procedure: BIOPSY;  Surgeon: Corbin Ade, MD;  [facility]: [facility];  Service: Endoscopy;;  gastric   CHOLECYSTECTOMY N/A 08/14/2018   Procedure: LAPAROSCOPIC CHOLECYSTECTOMY;  Surgeon: Franky Macho, MD;  Location: [facility];  Service: General;  Laterality: N/A;   COLONOSCOPY WITH ESOPHAGOGASTRODUODENOSCOPY (EGD)  06/2018   Dr. Gabriel Cirri: EGD "normal".  Colonoscopy with hemorrhoids.   COLONOSCOPY WITH PROPOFOL N/A 06/11/2021   Procedure: COLONOSCOPY WITH PROPOFOL;  Surgeon: Corbin Ade, MD;  [facility]: [facility];  Service: Endoscopy;  Laterality: N/A;  2:45pm   DILATION AND CURETTAGE OF UTERUS  2009   ESOPHAGEAL DILATION  06/11/2021   Procedure: ESOPHAGEAL DILATION;  Surgeon: Corbin Ade, MD;  [facility]: [facility];   Service: Endoscopy;;   ESOPHAGOGASTRODUODENOSCOPY (EGD) WITH PROPOFOL N/A 10/19/2018   Dr. Jena Gauss: Mild Schatzki ring found at [facility] with tiny erosions.  Mild longitudinal furrows of the tubular esophagus.  Small hiatal hernia.  Multiple erosions in the stomach.  Diffuse erythematous mucosa found in the entire stomach.  Esophagus dilated up to 54 [place].  Gastric biopsy mild reactive gastropathy, mild chronic gastritis. BX esophagitis with up to 30/HPF intraepithelial EOS   ESOPHAGOGASTRODUODENOSCOPY (EGD) WITH PROPOFOL N/A 06/11/2021   Procedure: ESOPHAGOGASTRODUODENOSCOPY (EGD) WITH PROPOFOL;  Surgeon: Corbin Ade, MD;  [facility];  Service: Endoscopy;  Laterality: N/A;   IVF  2007, 099 amd 2010   LAPAROSCOPIC ABDOMINAL EXPLORATION     R/o endometriosis   MALONEY DILATION N/A 10/19/2018   Procedure: Elease Hashimoto DILATION;  Surgeon: Corbin Ade, MD;  [facility]: [facility];  Service: Endoscopy;  Laterality: N/A;    Prior to Admission medications   Medication Sig Start Date End Date Taking? Authorizing Provider  acetaminophen (TYLENOL) 500 MG tablet Take 500-1,000 mg by mouth every 6 (six) hours as needed (for pain.).   Yes [provider]  calcium carbonate (TUMS - DOSED IN MG ELEMENTAL CALCIUM) 500 MG chewable tablet Chew 1 tablet by mouth 2 (two) times daily as needed for indigestion or heartburn.   Yes [provider]  pantoprazole (PROTONIX) 40 MG tablet Take 40 mg by mouth daily as needed.   Yes [provider]  sertraline (ZOLOFT) 100 MG tablet take 2 tablets by mouth at bedtime 05/06/23  Yes Shawna Clamp  R, CNM  sucralfate (CARAFATE) 1 g tablet Take 1 g by mouth 4 (four) times daily -  with meals and at bedtime.   Yes [provider]    Allergies as of 05/27/2023 - Review Complete 05/27/2023  Allergen Reaction Noted   Bee venom Anaphylaxis and Swelling 08/10/2018   Egg-derived products Anaphylaxis 10/09/2020   Shellfish allergy  Hives 10/16/2018   Penicillins Rash 06/04/2016    Family History  Problem Relation Age of Onset   Congestive Heart Failure Father    Dementia Father    Thyroid disease Father    Arthritis Mother    Hypertension Mother    Alzheimer's disease Paternal Grandmother    Cancer Paternal Grandfather    Cancer Maternal Grandfather    Anxiety disorder Son    Depression Son    OCD Son    Autism Son    Hearing loss Son        has hearing aid   Colon cancer Neg Hx    Colon polyps Neg Hx     Social History   Socioeconomic History   Marital status: Married    Spouse name: Not on file   Number of children: 4   Years of education: 18   Highest education level: Not on file  Occupational History   Occupation: Real estate agent  Tobacco Use   Smoking status: Never   Smokeless tobacco: Never  Vaping Use   Vaping Use: Never used  Substance and Sexual Activity   Alcohol use: No    Alcohol/week: 0.0 standard drinks of alcohol   Drug use: No   Sexual activity: Yes    Birth control/protection: None    Comment: husband had vasectomy  Other Topics Concern   Not on file  Social History Narrative   Lives at home w/ her husband and 4 children   Right-handed   Rare caffeine   Social Determinants of Health   Financial Resource Strain: Low Risk  (05/26/2023)   Overall Financial Resource Strain (CARDIA)    Difficulty of Paying Living Expenses: Not hard at all  Food Insecurity: No Food Insecurity (05/26/2023)   Hunger Vital Sign    Worried About Running Out of Food in the Last Year: Never true    Ran Out of Food in the Last Year: Never true  Transportation Needs: No Transportation Needs (05/26/2023)   PRAPARE - Administrator, Civil Service (Medical): No    Lack of Transportation (Non-Medical): No  Physical Activity: Inactive (05/26/2023)   Exercise Vital Sign    Days of Exercise per Week: 0 days    Minutes of Exercise per Session: 0 min  Stress: Stress Concern Present (05/26/2023)    [employer] of Occupational Health - Occupational Stress Questionnaire    Feeling of Stress : To some extent  Social Connections: Socially Integrated (05/26/2023)   Social Connection and Isolation Panel [NHANES]    Frequency of Communication with Friends and Family: More than three times a week    Frequency of Social Gatherings with Friends and Family: More than three times a week    Attends Religious Services: More than 4 times per year    Active Member of [employer] or Organizations: Yes    Attends Banker Meetings: More than 4 times per year    Marital Status: Married  Catering manager Violence: Not At Risk (05/26/2023)   Humiliation, Afraid, Rape, and Kick questionnaire    Fear of Current or Ex-Partner: No  Emotionally Abused: No    Physically Abused: No    Sexually Abused: No    Review of Systems: See HPI, otherwise negative ROS  Physical Exam: BP 109/69 (BP Location: Right Arm, Patient Position: Sitting, Cuff Size: Normal)   Pulse 66   Temp 98.5 F (36.9 C) (Oral)   Ht 5\' 9"  (1.753 m)   Wt 160 lb 12.8 oz (72.9 kg)   LMP 05/02/2023   SpO2 97%   BMI 23.75 kg/m  General:   Alert,  Well-developed, well-nourished, pleasant and cooperative in NAD Neck:  Supple; no masses or thyromegaly. No significant cervical adenopathy. Lungs:  Clear throughout to auscultation.   No wheezes, crackles, or rhonchi. No acute distress. Heart:  Regular rate and rhythm; no murmurs, clicks, rubs,  or gallops. Abdomen: Non-distended, normal bowel sounds.  Soft and nontender without appreciable mass or hepatosplenomegaly.  Impression/Plan: 52 year old lady with prior esophageal biopsies suggestive of EOE.  Distinct Schatzki's ring dilated at her last EGD.  No dysphagia now.  Upper GI tract symptoms consistent with poorly troll reflux.  Takes PPI on an as-needed basis.  Up-to-date on colonoscopy.  Takes constipation meds sporadically.  She needs a consistent regimen for management.  We  talked about IBS-C.  Our goal is a remission.  There is no cure. Recommendations:  GERD information provided, IBD-C information provided  Begin Protonix 40 mg twice daily.  Take every day, twice daily without fail  - 30 minutes before breakfast and supper (dispense 60 with 3 refills).  It is not necessary for you to take Carafate or budesonide at this time  As far as IBS-C is concerned our goal is to achieve many more good days than bad days as far as bowel function is concerned.  Trial of Linzess 72 gelcap 1 daily.  2-week supply samples provided.  Call me and let me know how you are doing on this regimen.  We may need to need to fine-tune.  Office visit here in 3 months   Notice: This dictation was prepared with Dragon dictation along with smaller phrase technology. Any transcriptional errors that result from this process are unintentional and may not be corrected upon review.

## 2023-05-27 NOTE — Patient Instructions (Signed)
It was nice to see you again today!  As discussed it seems your big issue these days is acid reflux.  I have the following recommendations:  GERD information provided, IBD-C information provided  Begin Protonix 40 mg twice daily.  Take every day, twice daily without fail  - 30 minutes before breakfast and supper (dispense 60 with 3 refills).  It is not necessary for you to take Carafate or budesonide at this time  As far as IBS-C is concerned our goal is to achieve many more good days than bad days as far as bowel function is concerned.  Trial of Linzess 72 gelcap 1 daily.  2-week supply samples provided.  Call me and let me know how you are doing on this regimen.  We may need to need to fine-tune.  Office visit here in 3 months

## 2023-05-28 ENCOUNTER — Encounter: Payer: Self-pay | Admitting: Internal Medicine

## 2023-05-28 ENCOUNTER — Other Ambulatory Visit: Payer: Self-pay

## 2023-05-28 MED ORDER — PANTOPRAZOLE SODIUM 40 MG PO TBEC: 40.0000 mg | DELAYED_RELEASE_TABLET | Freq: Two times a day (BID) | ORAL | 3 refills | Status: DC

## 2023-06-16 ENCOUNTER — Encounter: Payer: Self-pay | Admitting: Women's Health

## 2023-06-16 ENCOUNTER — Other Ambulatory Visit: Payer: Self-pay | Admitting: Women's Health

## 2023-06-16 MED ORDER — ESTRADIOL-NORETHINDRONE ACET 1-0.5 MG PO TABS: 1.0000 | ORAL_TABLET | Freq: Every day | ORAL | 12 refills | Status: DC

## 2023-07-15 DIAGNOSIS — H1032 Unspecified acute conjunctivitis, left eye: Secondary | ICD-10-CM | POA: Diagnosis not present

## 2023-08-07 DIAGNOSIS — H11442 Conjunctival cysts, left eye: Secondary | ICD-10-CM | POA: Diagnosis not present

## 2023-11-04 ENCOUNTER — Encounter: Payer: Self-pay | Admitting: Obstetrics and Gynecology

## 2023-11-04 ENCOUNTER — Ambulatory Visit: Payer: 59 | Admitting: Obstetrics and Gynecology

## 2023-11-04 VITALS — BP 104/70 | HR 73

## 2023-11-04 DIAGNOSIS — M6289 Other specified disorders of muscle: Secondary | ICD-10-CM | POA: Diagnosis not present

## 2023-11-04 DIAGNOSIS — N814 Uterovaginal prolapse, unspecified: Secondary | ICD-10-CM

## 2023-11-04 DIAGNOSIS — N816 Rectocele: Secondary | ICD-10-CM | POA: Diagnosis not present

## 2023-11-04 DIAGNOSIS — N8110 Cystocele, unspecified: Secondary | ICD-10-CM | POA: Diagnosis not present

## 2023-11-04 NOTE — Progress Notes (Signed)
[facility] Return Visit  SUBJECTIVE  History of Present Illness: Kayla White is a 52 y.o. female seen in follow-up for prolapse.   Patient was initially seen by Dr. Florian Buff back in October 2023.  She had a stage I anterior prolapse, stage I uterine prolapse, and stage II posterior prolapse.  Patient reports that she feels like things have gotten worse and she is concerned.  Patient felt very overwhelmed last time with all the information and reports she felt rushed for the amount of issues that she had to discuss.  Patient reports she is mildly anxious as she was recently decorating for the holidays and she went to squat and felt like her insides were "falling out".      Past Medical History: Patient  has a past medical history of Anxiety, Depression, Eosinophilic esophagitis, GERD (gastroesophageal reflux disease), IBS (irritable bowel syndrome), Migraines, and PONV (postoperative nausea and vomiting).   Past Surgical History: She  has a past surgical history that includes Laparoscopic abdominal exploration; IVF (2007, 099 amd 2010); Dilation and curettage of uterus (2009); Cholecystectomy (N/A, 08/14/2018); Colonoscopy with esophagogastroduodenoscopy (egd) (06/2018); Esophagogastroduodenoscopy (egd) with propofol (N/A, 10/19/2018); maloney dilation (N/A, 10/19/2018); biopsy (10/19/2018); Colonoscopy with propofol (N/A, 06/11/2021); Esophagogastroduodenoscopy (egd) with propofol (N/A, 06/11/2021); and Esophageal dilation (06/11/2021).   Medications: She has a current medication list which includes the following prescription(s): acetaminophen, calcium carbonate, estradiol-norethindrone, pantoprazole, sertraline, and sucralfate.   Allergies: Patient is allergic to bee venom, egg-derived products, shellfish allergy, and penicillins.   Social History: Patient  reports that she has never smoked. She has never used smokeless tobacco. She reports that she does not drink  alcohol and does not use drugs.      OBJECTIVE     Physical Exam: Vitals:   11/04/23 0857  BP: 104/70  Pulse: 73   Gen: No apparent distress, A&O x 3.  Detailed Urogynecologic Evaluation:   Noted pelvic floor dysfunction on exam similar to previous exam with noted dyssynergia when attempting to bear down or do a Kegel patient's pelvic floor strength is a 1 out of 4 on exam today.  POP Q repeated when lying down and then also had patient's stand so we could see where the leading edge was.  When she was lying down it seemed bleeding age was going to be more anterior vaginal wall, but once she was standing the cervix was right at the vaginal opening.  Updated POP-Q POP-Q  -1                                            Aa   -1                                           Ba  0                                              C   5  Gh  4                                            Pb  10                                            tvl   -1                                            Ap  -1                                            Bp  -7.5                                              D   A #5 Shaatz pessary (Lot [id number]) was placed today and patient was able to insert and remove the pessary.       ASSESSMENT AND PLAN    Kayla White is a 52 y.o. with:  1. Uterine prolapse   2. Prolapse of anterior vaginal wall   3. Prolapse of posterior vaginal wall   4. Pelvic floor dysfunction    For patient's pelvic floor dysfunction today we discussed what her deficits mean using the POP Q tool.  Things were discussed with her regarding options for treatment including surgical repair, pessary placement, and physical therapy.  Patient reports that her GYN physician has mentioned multiple times to her that she would be a good candidate for pelvic floor physical therapy.  She is interested in pursuing this option.  A referral for pelvic floor  physical therapy was placed today and the information was given for her to call and make an appointment.  Patient is in [place] and therefore there is no physical therapy close to her.  Referral placed for pelvic floor physical therapy at [facility] office as she will have an easier time getting an appointment there. Patient opted today to have a pessary placed.  A #5 Shaatz pessary was placed today.  Patient was able to squat, attempt urination, and attempted defecation without pessary expulsion.  Patient reports it felt comfortable and that she could barely feel the pessary when in place.  Patient is young and sexually active so we used a large mirror to practice placing and removing the pessary.  A string was tied around the pessary so that she could use this to assist her in getting in and out.  We discussed removing the pessary once a week. Patient is interested in potentially discussing surgical options further depending on how her trial with the pessary goes.  We discussed 2 main options including a sacrocolpopexy with mesh and a sacrospinous ligament fixation without mesh with the options to keep or remove the uterus.  We briefly discussed recovery time but did not go extremely far in depth and these options. Patient would like to discuss these options  further potentially with Dr. Olena Leatherwood.  I discussed with patient that we will call her to make an appointment in a few weeks and see how she is done with the pessary.  She can also call or send a MyChart message at any time to let me know how she is doing with the pessary or if it becomes too bothersome and she needs assistance in removing it.  Patient is agreeable with this plan of care.   Patient to follow-up in 4 weeks for pessary check and further surgical discussion if she desires.   Selmer Dominion, NP

## 2023-11-04 NOTE — Patient Instructions (Signed)
I have placed a referral for PT.   You have a Shaatz pessary in place. Please let me know if this gets uncomfortable or if you have problems getting it in and out.

## 2023-11-11 ENCOUNTER — Ambulatory Visit: Payer: 59 | Admitting: Obstetrics and Gynecology

## 2023-11-14 ENCOUNTER — Encounter: Payer: Self-pay | Admitting: Obstetrics

## 2023-11-14 ENCOUNTER — Ambulatory Visit (INDEPENDENT_AMBULATORY_CARE_PROVIDER_SITE_OTHER): Payer: 59 | Admitting: Obstetrics

## 2023-11-14 VITALS — BP 105/67 | HR 80

## 2023-11-14 DIAGNOSIS — N812 Incomplete uterovaginal prolapse: Secondary | ICD-10-CM

## 2023-11-14 DIAGNOSIS — R159 Full incontinence of feces: Secondary | ICD-10-CM | POA: Insufficient documentation

## 2023-11-14 DIAGNOSIS — Z4689 Encounter for fitting and adjustment of other specified devices: Secondary | ICD-10-CM | POA: Insufficient documentation

## 2023-11-14 DIAGNOSIS — N3281 Overactive bladder: Secondary | ICD-10-CM | POA: Insufficient documentation

## 2023-11-14 DIAGNOSIS — N393 Stress incontinence (female) (male): Secondary | ICD-10-CM | POA: Insufficient documentation

## 2023-11-14 DIAGNOSIS — K5904 Chronic idiopathic constipation: Secondary | ICD-10-CM | POA: Diagnosis not present

## 2023-11-14 DIAGNOSIS — N952 Postmenopausal atrophic vaginitis: Secondary | ICD-10-CM | POA: Diagnosis not present

## 2023-11-14 DIAGNOSIS — M6289 Other specified disorders of muscle: Secondary | ICD-10-CM | POA: Insufficient documentation

## 2023-11-14 DIAGNOSIS — N814 Uterovaginal prolapse, unspecified: Secondary | ICD-10-CM | POA: Insufficient documentation

## 2023-11-14 MED ORDER — ESTRADIOL 0.1 MG/GM VA CREA
0.5000 g | TOPICAL_CREAM | VAGINAL | 3 refills | Status: DC
Start: 2023-11-17 — End: 2024-04-16

## 2023-11-14 NOTE — Assessment & Plan Note (Addendum)
-   reviewed stage II pelvic organ prolapse - size 5 shaatz pessary use for 3 days with resolution of bulge and urinary leakage symptoms, unable to replace. Reviewed pelvic anatomy and patient was able to replace pessary with minimal assistance via string.  - able to remove and place size 6 ring with support with ease, office to order.  - encouraged patient to consider pessary refitting if descent noted.  - encouraged fiber supplementation and miralax titration to optimize stool consistency - For treatment of pelvic organ prolapse, we discussed options for management including expectant management, conservative management, and surgical management, such as Kegels, a pessary, pelvic floor physical therapy, and specific surgical procedures. We discussed two options for prolapse repair per patient request:  1) vaginal repair without mesh - Pros - safer, no mesh complications - Cons - not as strong as mesh repair, higher risk of recurrence  2) laparoscopic repair with mesh - Pros - stronger, better long-term success - Cons - risks of mesh implant (erosion into vagina or bladder, adhering to the rectum, pain) - these risks are lower than with a vaginal mesh but still exist

## 2023-11-14 NOTE — Assessment & Plan Note (Signed)
-   reports sensation of dryness with pessary management - For symptomatic vaginal atrophy options include lubrication with a water-based lubricant, personal hygiene measures and barrier protection against wetness, and estrogen replacement in the form of vaginal cream, vaginal tablets, or a time-released vaginal ring.   - Rx low dose vaginal estrogen to start - reports activella use 1x/week, encouraged discontinuation due to lack of vasomotor symptoms

## 2023-11-14 NOTE — Assessment & Plan Note (Signed)
-   continue self mgmt of size 5 shaatz pessary - office to order size 6 ring with support pessary - consider increasing to size 7 ring with support pessary  - discussed risk of vaginal ulceration, discharge, bleeding, or fistula formulation. Patient to return if change in vaginal discharge or bleeding noted.

## 2023-11-14 NOTE — Assessment & Plan Note (Signed)
-   symptoms resolved with pessary use - prior use of mirabegron, oxybutynin, and vesicare with dry mouth - We discussed the symptoms of overactive bladder (OAB), which include urinary urgency, urinary frequency, nocturia, with or without urge incontinence.  While we do not know the exact etiology of OAB, several treatment options exist. We discussed management including behavioral therapy (decreasing bladder irritants, urge suppression strategies, timed voids, bladder retraining), physical therapy, medication; for refractory cases posterior tibial nerve stimulation, sacral neuromodulation, and intravesical botulinum toxin injection.  For anticholinergic medications, we discussed the potential side effects of anticholinergics including dry eyes, dry mouth, constipation, cognitive impairment and urinary retention. For Beta-3 agonist medication, we discussed the potential side effect of elevated blood pressure which is more likely to occur in individuals with uncontrolled hypertension.

## 2023-11-14 NOTE — Assessment & Plan Note (Addendum)
-   For constipation, we reviewed the importance of a better bowel regimen.  We also discussed the importance of avoiding chronic straining, as it can exacerbate her pelvic floor symptoms; we discussed treating constipation and straining prior to surgery, as postoperative straining can lead to damage to the repair and recurrence of symptoms. We discussed initiating therapy with increasing fluid intake, fiber supplementation, stool softeners, and laxatives such as miralax.  - encouraged fiber supplementation due to [facility] I stool with 1/2 capful miralax use.  - avoid straining with bowel movements due to association with worsening bulge symptoms.

## 2023-11-14 NOTE — Progress Notes (Signed)
[facility] Return Visit  SUBJECTIVE  History of Present Illness: Kayla White is a 52 y.o. female seen in follow-up for overactive bladder and Stage II pelvic organ prolapse. Plan at last visit was trial of size 5 shaatz pessary.   Had 2 twin deliveries Mostly bothered by bladder issues, previously evaluated by urology Avoids dietary triggers for bladder symptoms.  Vaginal bulge to opening  Tried size 5 shaatz pessary with relief of bulge and urinary symptoms, denies vaginal bleeding or discharge. Patient removed pessary after 72hrs and unable to replace. Pending pelvic floor PT referral  Prior use of Mirabegron 50mg  with dry eyes and lips, tried oxybutynin and vesicare with dry mouth Leaks 2-3 time(s) per day resolved with pessary use. UUI = SUI Prior pad use: 2- 3 liners/ mini-pads per day. Drinks: 5- 16oz glasses water History of IBS-C with BM 3x/week with straining with leakage. H/o glycerin suppositories PRN 2-3x/week, stool softener and miralax use 1/2 capful daily. H/o amitiza and linzess and different fiber supplements.   Past Medical History: Patient  has a past medical history of Anxiety, Depression, Eosinophilic esophagitis, GERD (gastroesophageal reflux disease), IBS (irritable bowel syndrome), Migraines, and PONV (postoperative nausea and vomiting).   Past Surgical History: She  has a past surgical history that includes Laparoscopic abdominal exploration; IVF (2007, 099 amd 2010); Dilation and curettage of uterus (2009); Cholecystectomy (N/A, 08/14/2018); Colonoscopy with esophagogastroduodenoscopy (egd) (06/2018); Esophagogastroduodenoscopy (egd) with propofol (N/A, 10/19/2018); maloney dilation (N/A, 10/19/2018); biopsy (10/19/2018); Colonoscopy with propofol (N/A, 06/11/2021); Esophagogastroduodenoscopy (egd) with propofol (N/A, 06/11/2021); and Esophageal dilation (06/11/2021).   Medications: She has a current medication list which includes the  following prescription(s): acetaminophen, calcium carbonate, [START ON 11/17/2023] estradiol, estradiol-norethindrone, pantoprazole, sertraline, and sucralfate.   Allergies: Patient is allergic to bee venom, egg-derived products, shellfish allergy, and penicillins.   Social History: Patient  reports that she has never smoked. She has never used smokeless tobacco. She reports that she does not drink alcohol and does not use drugs.     OBJECTIVE     Physical Exam: Vitals:   11/14/23 1450  BP: 105/67  Pulse: 80   Gen: No apparent distress, A&O x 3.  Detailed Urogynecologic Evaluation:  Reviewed pelvic anatomy with a mirror.  A size 5 shaatz pessary was placed by the patient. It was comfortable, stayed in place with valsalva and was an appropriate size on examination, with one finger fitting between the pessary and the vaginal walls. Patient attempted to removed pessary sitting and standing with some difficulty, removed pessary using string attached.  A size 6 ring with support pessary was placed by the patient. It was comfortable, stayed in place with valsalva and was an appropriate size on examination, with one finger fitting between the pessary and the vaginal walls. Patient removed pessary without difficulty (unavailable in the office).  A size 5 shaatz pessary was replaced by the patient.  The patient demonstrated proper removal and replacement.       No data to display             ASSESSMENT AND PLAN    [name] is a 52 y.o. with:  1. Uterine prolapse   2. Overactive bladder   3. SUI (stress urinary incontinence, female)   4. Chronic idiopathic constipation   5. Vaginal atrophy   6. Fitting and adjustment of pessary     Uterine prolapse Assessment & Plan: - reviewed stage II pelvic organ prolapse - size 5 shaatz pessary use for 3  days with resolution of bulge and urinary leakage symptoms, unable to replace. Reviewed pelvic anatomy and patient was able to  replace pessary with minimal assistance via string.  - able to remove and place size 6 ring with support with ease, office to order.  - encouraged patient to consider pessary refitting if descent noted.  - encouraged fiber supplementation and miralax titration to optimize stool consistency - For treatment of pelvic organ prolapse, we discussed options for management including expectant management, conservative management, and surgical management, such as Kegels, a pessary, pelvic floor physical therapy, and specific surgical procedures. We discussed two options for prolapse repair per patient request:  1) vaginal repair without mesh - Pros - safer, no mesh complications - Cons - not as strong as mesh repair, higher risk of recurrence  2) laparoscopic repair with mesh - Pros - stronger, better long-term success - Cons - risks of mesh implant (erosion into vagina or bladder, adhering to the rectum, pain) - these risks are lower than with a vaginal mesh but still exist   Overactive bladder Assessment & Plan: - symptoms resolved with pessary use - prior use of mirabegron, oxybutynin, and vesicare with dry mouth - We discussed the symptoms of overactive bladder (OAB), which include urinary urgency, urinary frequency, nocturia, with or without urge incontinence.  While we do not know the exact etiology of OAB, several treatment options exist. We discussed management including behavioral therapy (decreasing bladder irritants, urge suppression strategies, timed voids, bladder retraining), physical therapy, medication; for refractory cases posterior tibial nerve stimulation, sacral neuromodulation, and intravesical botulinum toxin injection.  For anticholinergic medications, we discussed the potential side effects of anticholinergics including dry eyes, dry mouth, constipation, cognitive impairment and urinary retention. For Beta-3 agonist medication, we discussed the potential side effect of elevated  blood pressure which is more likely to occur in individuals with uncontrolled hypertension.    SUI (stress urinary incontinence, female) Assessment & Plan: - leakage symptoms resolved with pessary use - For treatment of stress urinary incontinence,  non-surgical options include expectant management, weight loss, physical therapy, as well as a pessary.  Surgical options include a midurethral sling, Burch urethropexy, and transurethral injection of a bulking agent.    Chronic idiopathic constipation Assessment & Plan: - For constipation, we reviewed the importance of a better bowel regimen.  We also discussed the importance of avoiding chronic straining, as it can exacerbate her pelvic floor symptoms; we discussed treating constipation and straining prior to surgery, as postoperative straining can lead to damage to the repair and recurrence of symptoms. We discussed initiating therapy with increasing fluid intake, fiber supplementation, stool softeners, and laxatives such as miralax.  - encouraged fiber supplementation due to [facility] I stool with 1/2 capful miralax use.  - avoid straining with bowel movements due to association with worsening bulge symptoms.   Vaginal atrophy Assessment & Plan: - reports sensation of dryness with pessary management - For symptomatic vaginal atrophy options include lubrication with a water-based lubricant, personal hygiene measures and barrier protection against wetness, and estrogen replacement in the form of vaginal cream, vaginal tablets, or a time-released vaginal ring.   - Rx low dose vaginal estrogen to start - reports activella use 1x/week, encouraged discontinuation due to lack of vasomotor symptoms  Orders: -     Estradiol; Place 0.5 g vaginally 2 (two) times a week. Place 0.5g nightly for two weeks then twice a week after  Dispense: 30 g; Refill: 3  Fitting and adjustment of pessary  Assessment & Plan: - continue self mgmt of size 5 shaatz  pessary - office to order size 6 ring with support pessary - consider increasing to size 7 ring with support pessary  - discussed risk of vaginal ulceration, discharge, bleeding, or fistula formulation. Patient to return if change in vaginal discharge or bleeding noted.    Time spent: I spent 54 minutes dedicated to the care of this patient on the date of this encounter to include pre-visit review of records, face-to-face time with the patient discussing stage II pelvic organ prolapse and post visit documentation and ordering medication/ testing.   Loleta Chance, MD

## 2023-11-14 NOTE — Assessment & Plan Note (Signed)
-   leakage symptoms resolved with pessary use - For treatment of stress urinary incontinence,  non-surgical options include expectant management, weight loss, physical therapy, as well as a pessary.  Surgical options include a midurethral sling, Burch urethropexy, and transurethral injection of a bulking agent.

## 2023-11-14 NOTE — Patient Instructions (Addendum)
Continue to manage your size 5 shaatz pessary. This will keep the bulge inside and prevent it from getting worse. You can learn how to remove it yourself or we can do that for you every 2-3 months. Please call if you experience any bleeding or abnormal discharge.   For vaginal atrophy (thinning of the vaginal tissue that can cause dryness and burning) and UTI prevention we discussed estrogen replacement in the form of vaginal cream.   Start vaginal estrogen therapy nightly for two weeks then 2 times weekly at night. This can be placed with your finger or an applicator inside the vagina and around the urethra.  Please let us know if the prescription is too expensive and we can look for alternative options.   Is vaginal estrogen therapy safe for me? Vaginal estrogen preparations act on the vaginal skin, and only a very tiny amount is absorbed into the bloodstream (0.01%).  They work in a similar way to hand or face cream.  There is minimal absorption and they are therefore perfectly safe. If you have had breast cancer and have persistent troublesome symptoms which aren't settling with vaginal moisturisers and lubricants, local estrogen treatment may be a possibility, but consultation with your oncologist should take place first.    Proceed with pelvic floor PT.   Women should try to eat at least 21 to 25 grams of fiber a day, while men should aim for 30 to 38 grams a day. You can add fiber to your diet with food or a fiber supplement such as psyllium (metamucil), benefiber, or fibercon.   Here's a look at how much dietary fiber is found in some common foods. When buying packaged foods, check the Nutrition Facts label for fiber content. It can vary among brands.  Fruits Serving size Total fiber (grams)*  Raspberries 1 cup 8.0  Pear 1 medium 5.5  Apple, with skin 1 medium 4.5  Banana 1 medium 3.0  Orange 1 medium 3.0  Strawberries 1 cup 3.0   Vegetables Serving size Total fiber (grams)*  Green  peas, boiled 1 cup 9.0  Broccoli, boiled 1 cup chopped 5.0  Turnip greens, boiled 1 cup 5.0  Brussels sprouts, boiled 1 cup 4.0  Potato, with skin, baked 1 medium 4.0  Sweet corn, boiled 1 cup 3.5  Cauliflower, raw 1 cup chopped 2.0  Carrot, raw 1 medium 1.5   Grains Serving size Total fiber (grams)*  Spaghetti, whole-wheat, cooked 1 cup 6.0  Barley, pearled, cooked 1 cup 6.0  Bran flakes 3/4 cup 5.5  Quinoa, cooked 1 cup 5.0  Oat bran muffin 1 medium 5.0  Oatmeal, instant, cooked 1 cup 5.0  Popcorn, air-popped 3 cups 3.5  Brown rice, cooked 1 cup 3.5  Bread, whole-wheat 1 slice 2.0  Bread, rye 1 slice 2.0   Legumes, nuts and seeds Serving size Total fiber (grams)*  Split peas, boiled 1 cup 16.0  Lentils, boiled 1 cup 15.5  Black beans, boiled 1 cup 15.0  Baked beans, canned 1 cup 10.0  Chia seeds 1 ounce 10.0  Almonds 1 ounce (23 nuts) 3.5  Pistachios 1 ounce (49 nuts) 3.0  Sunflower kernels 1 ounce 3.0  *Rounded to nearest 0.5 gram. Source: [employer] for [employer]	[place]

## 2023-12-24 ENCOUNTER — Other Ambulatory Visit (HOSPITAL_COMMUNITY): Payer: Self-pay | Admitting: Obstetrics & Gynecology

## 2023-12-24 DIAGNOSIS — Z1231 Encounter for screening mammogram for malignant neoplasm of breast: Secondary | ICD-10-CM

## 2023-12-26 ENCOUNTER — Ambulatory Visit (INDEPENDENT_AMBULATORY_CARE_PROVIDER_SITE_OTHER): Payer: 59 | Admitting: Obstetrics

## 2023-12-26 ENCOUNTER — Encounter: Payer: Self-pay | Admitting: Obstetrics

## 2023-12-26 VITALS — BP 108/66 | HR 66

## 2023-12-26 DIAGNOSIS — N393 Stress incontinence (female) (male): Secondary | ICD-10-CM

## 2023-12-26 DIAGNOSIS — N898 Other specified noninflammatory disorders of vagina: Secondary | ICD-10-CM | POA: Diagnosis not present

## 2023-12-26 DIAGNOSIS — Z4689 Encounter for fitting and adjustment of other specified devices: Secondary | ICD-10-CM

## 2023-12-26 DIAGNOSIS — N812 Incomplete uterovaginal prolapse: Secondary | ICD-10-CM

## 2023-12-26 DIAGNOSIS — N814 Uterovaginal prolapse, unspecified: Secondary | ICD-10-CM

## 2023-12-26 DIAGNOSIS — N3281 Overactive bladder: Secondary | ICD-10-CM

## 2023-12-26 DIAGNOSIS — N952 Postmenopausal atrophic vaginitis: Secondary | ICD-10-CM

## 2023-12-26 MED ORDER — FLUCONAZOLE 150 MG PO TABS: 150.0000 mg | ORAL_TABLET | Freq: Once | ORAL | 1 refills | Status: AC

## 2023-12-26 NOTE — Assessment & Plan Note (Signed)
-   symptoms previously resolved with pessary use, reassess after size 6 shaatz pessary - prior use of mirabegron, oxybutynin, and vesicare with dry mouth, consider trial of Gemtesa - We discussed the symptoms of overactive bladder (OAB), which include urinary urgency, urinary frequency, nocturia, with or without urge incontinence.  While we do not know the exact etiology of OAB, several treatment options exist. We discussed management including behavioral therapy (decreasing bladder irritants, urge suppression strategies, timed voids, bladder retraining), physical therapy, medication; for refractory cases posterior tibial nerve stimulation, sacral neuromodulation, and intravesical botulinum toxin injection.  For anticholinergic medications, we discussed the potential side effects of anticholinergics including dry eyes, dry mouth, constipation, cognitive impairment and urinary retention. For Beta-3 agonist medication, we discussed the potential side effect of elevated blood pressure which is more likely to occur in individuals with uncontrolled hypertension.

## 2023-12-26 NOTE — Assessment & Plan Note (Signed)
-   continue self mgmt of size 6 shaatz pessary due to descent noted with size 5 shaatz pessary - office pending order size 6 ring with support pessary, encouraged pt to order online - consider increasing to size 7 ring with support pessary  - discussed risk of vaginal ulceration, discharge, bleeding, or fistula formulation. Patient to return if change in vaginal discharge or bleeding noted.

## 2023-12-26 NOTE — Patient Instructions (Addendum)
Continue to manage your size 6 shaatz pessary.   Call office for repeat exam if you experiences vaginal bleeding or abnormal discharge.  If you choose to purchase your own pessary, look for white (miltex) size 6 ring with support or size 7 ring with support pessaries.   Continue vaginal estrogen 1g twice a week.

## 2023-12-26 NOTE — Assessment & Plan Note (Addendum)
-   leakage symptoms resolved with pessary use - For treatment of stress urinary incontinence,  non-surgical options include expectant management, weight loss, physical therapy, as well as a pessary.  Surgical options include a midurethral sling, Burch urethropexy, and transurethral injection of a bulking agent. - consider incontinence pessary if symptoms return - continue vaginal estrogen

## 2023-12-26 NOTE — Assessment & Plan Note (Addendum)
-   bilateral <1cm fluid filled cysts around 2-3 and 9-10 o'clock  - no erythema or sign of infection, mobile, denies pain but reports irritation - reviewed etiology includes Gartner's duct cyst or Skene's gland cysts. Encouraged pt to monitor size and symptoms, repeat exam at follow-up. - consider MRI if clinical change - Rx diflucan if symptoms of irritation persist or worsens

## 2023-12-26 NOTE — Assessment & Plan Note (Signed)
-   reports sensation of dryness with pessary management - For symptomatic vaginal atrophy options include lubrication with a water-based lubricant, personal hygiene measures and barrier protection against wetness, and estrogen replacement in the form of vaginal cream, vaginal tablets, or a time-released vaginal ring.   - continue low dose vaginal estrogen 2-3x/week - provided vaginal lubricant, encouraged moisturizers and Vit E suppository - Rx diflucan if symptoms persistent or worsens. - prior activella use 1x/week, discontinued due to lack of vasomotor symptoms

## 2023-12-26 NOTE — Progress Notes (Addendum)
[facility] Return Visit  SUBJECTIVE  History of Present Illness: Kayla White is a 53 y.o. female seen in follow-up for overactive bladder and Stage II pelvic organ prolapse . Plan at last visit was trial of size 5 shaatz pessary self management.    Able to self manage pessary using string, removed string for the past 2 days and able to replace and remove nightly. Leaks 2-3x/day previously resolved with pessary use. UUI = SUI Prior use of Mirabegron 50mg  with dry eyes and lips, tried oxybutynin and vesicare with dry mouth Reports irritation resumed 1 week ago, attributed to dietary changes Drinks: 5 x 16oz glasses water H/o IBS-C with BM 3x/wk with straining with leakage. H/o glycerin suppositories PRN 2-3x/wk, stool softener and miralax use 1/2 capful daily. H/o amitiza and linzess and different fiber supplements.  Reports vaginal irritation described as dryness, denies abnormal discharge or bleeding  Past Medical History: Patient  has a past medical history of Anxiety, Depression, Eosinophilic esophagitis, GERD (gastroesophageal reflux disease), IBS (irritable bowel syndrome), Migraines, and PONV (postoperative nausea and vomiting).   Past Surgical History: She  has a past surgical history that includes Laparoscopic abdominal exploration; IVF (2007, 099 amd 2010); Dilation and curettage of uterus (2009); Cholecystectomy (N/A, 08/14/2018); Colonoscopy with esophagogastroduodenoscopy (egd) (06/2018); Esophagogastroduodenoscopy (egd) with propofol (N/A, 10/19/2018); maloney dilation (N/A, 10/19/2018); biopsy (10/19/2018); Colonoscopy with propofol (N/A, 06/11/2021); Esophagogastroduodenoscopy (egd) with propofol (N/A, 06/11/2021); and Esophageal dilation (06/11/2021).   Medications: She has a current medication list which includes the following prescription(s): acetaminophen, calcium carbonate, estradiol, estradiol-norethindrone, fluconazole, pantoprazole, sertraline, and  sucralfate.   Allergies: Patient is allergic to bee venom, egg-derived products, shellfish allergy, and penicillins.   Social History: Patient  reports that she has never smoked. She has never used smokeless tobacco. She reports that she does not drink alcohol and does not use drugs.     OBJECTIVE     Physical Exam: Vitals:   12/26/23 1453  BP: 108/66  Pulse: 66   Physical Exam Constitutional:      General: She is not in acute distress.    Appearance: Normal appearance.  Genitourinary:     Urethral meatus normal.     No lesions in the vagina.     Genitourinary Comments: 6-23mm fluid filled cysts with no erythema or discharge, pt reports discomfort with palpation.     Right Labia: No rash, tenderness, lesions, skin changes or Bartholin's cyst.    Left Labia: No tenderness, lesions, skin changes, Bartholin's cyst or rash.       No vaginal discharge, erythema, tenderness, bleeding, ulceration or granulation tissue.     No cervical discharge.     Urethral meatus caruncle not present.    No urethral prolapse, tenderness, mass, hypermobility or discharge present.  Cardiovascular:     Rate and Rhythm: Normal rate.  Pulmonary:     Effort: Pulmonary effort is normal. No respiratory distress.  Abdominal:     General: There is no distension.     Palpations: Abdomen is soft. There is no mass.     Tenderness: There is no abdominal tenderness.     Hernia: No hernia is present.  Neurological:     Mental Status: She is alert.  Vitals reviewed. Exam conducted with a chaperone present.   A size 5 shaatz pessary in placed with some descent during valsalva. Patient removed pessary standing.  A size 6 shaatz pessary was placed by the patient. It was comfortable, stayed in place with valsalva and  was an appropriate size on examination, with one finger fitting between the pessary and the vaginal walls. (Previously fitted for size 6 ring with support pessary, none available in office) LOT  [id number]       No data to display             ASSESSMENT AND PLAN    Kayla White is a 53 y.o. with:  1. Uterine prolapse   2. Overactive bladder   3. SUI (stress urinary incontinence, female)   4. Vaginal atrophy   5. Vaginal cyst   6. Fitting and adjustment of pessary     Uterine prolapse Assessment & Plan: - reviewed stage II pelvic organ prolapse - self manage size 5 shaatz pessary use previously with resolution of bulge and urinary leakage symptoms until 1 week ago - trial of size 6 shaatz pessary - able to remove and place size 6 ring with support with ease, office pending order. Provided information for pt to purchase online if desired - encouraged fiber supplementation and miralax titration to optimize stool consistency - For treatment of pelvic organ prolapse, we discussed options for management including expectant management, conservative management, and surgical management, such as Kegels, a pessary, pelvic floor physical therapy, and specific surgical procedures. We discussed two options for prolapse repair per patient request:  1) vaginal repair without mesh - Pros - safer, no mesh complications - Cons - not as strong as mesh repair, higher risk of recurrence  2) laparoscopic repair with mesh - Pros - stronger, better long-term success - Cons - risks of mesh implant (erosion into vagina or bladder, adhering to the rectum, pain) - these risks are lower than with a vaginal mesh but still exist - patient desires to continue pessary management at this time   Overactive bladder Assessment & Plan: - symptoms previously resolved with pessary use, reassess after size 6 shaatz pessary - prior use of mirabegron, oxybutynin, and vesicare with dry mouth, consider trial of Gemtesa - We discussed the symptoms of overactive bladder (OAB), which include urinary urgency, urinary frequency, nocturia, with or without urge incontinence.  While we do not know the exact  etiology of OAB, several treatment options exist. We discussed management including behavioral therapy (decreasing bladder irritants, urge suppression strategies, timed voids, bladder retraining), physical therapy, medication; for refractory cases posterior tibial nerve stimulation, sacral neuromodulation, and intravesical botulinum toxin injection.  For anticholinergic medications, we discussed the potential side effects of anticholinergics including dry eyes, dry mouth, constipation, cognitive impairment and urinary retention. For Beta-3 agonist medication, we discussed the potential side effect of elevated blood pressure which is more likely to occur in individuals with uncontrolled hypertension.   SUI (stress urinary incontinence, female) Assessment & Plan: - leakage symptoms resolved with pessary use - For treatment of stress urinary incontinence,  non-surgical options include expectant management, weight loss, physical therapy, as well as a pessary.  Surgical options include a midurethral sling, Burch urethropexy, and transurethral injection of a bulking agent. - consider incontinence pessary if symptoms return - continue vaginal estrogen   Vaginal atrophy Assessment & Plan: - reports sensation of dryness with pessary management - For symptomatic vaginal atrophy options include lubrication with a water-based lubricant, personal hygiene measures and barrier protection against wetness, and estrogen replacement in the form of vaginal cream, vaginal tablets, or a time-released vaginal ring.   - continue low dose vaginal estrogen 2-3x/week - provided vaginal lubricant, encouraged moisturizers and Vit E suppository - Rx diflucan if symptoms persistent  or worsens. - prior activella use 1x/week, discontinued due to lack of vasomotor symptoms  Orders: -     Fluconazole; Take 1 tablet (150 mg total) by mouth once for 1 dose. Repeat no sooner than 72 hours if symptoms improve and returns  Dispense:  1 tablet; Refill: 1  Vaginal cyst Assessment & Plan: - bilateral <1cm fluid filled cysts around 2-3 and 9-10 o'clock  - no erythema or sign of infection, mobile, denies pain but reports irritation - reviewed etiology includes Gartner's duct cyst or Skene's gland cysts. Encouraged pt to monitor size and symptoms, repeat exam at follow-up. - consider MRI if clinical change - Rx diflucan if symptoms of irritation persist or worsens   Fitting and adjustment of pessary Assessment & Plan: - continue self mgmt of size 6 shaatz pessary due to descent noted with size 5 shaatz pessary - office pending order size 6 ring with support pessary, encouraged pt to order online - consider increasing to size 7 ring with support pessary  - discussed risk of vaginal ulceration, discharge, bleeding, or fistula formulation. Patient to return if change in vaginal discharge or bleeding noted.     Time spent: I spent 42 minutes dedicated to the care of this patient on the date of this encounter to include pre-visit review of records, face-to-face time with the patient discussing stage II pelvic organ prolapse, SUI, OAB, vaginal atrophy, vaginal cyst, and post visit documentation and ordering medication/ testing outside of pessary fitting.    Loleta Chance, MD

## 2023-12-26 NOTE — Assessment & Plan Note (Signed)
-   reviewed stage II pelvic organ prolapse - self manage size 5 shaatz pessary use previously with resolution of bulge and urinary leakage symptoms until 1 week ago - trial of size 6 shaatz pessary - able to remove and place size 6 ring with support with ease, office pending order. Provided information for pt to purchase online if desired - encouraged fiber supplementation and miralax titration to optimize stool consistency - For treatment of pelvic organ prolapse, we discussed options for management including expectant management, conservative management, and surgical management, such as Kegels, a pessary, pelvic floor physical therapy, and specific surgical procedures. We discussed two options for prolapse repair per patient request:  1) vaginal repair without mesh - Pros - safer, no mesh complications - Cons - not as strong as mesh repair, higher risk of recurrence  2) laparoscopic repair with mesh - Pros - stronger, better long-term success - Cons - risks of mesh implant (erosion into vagina or bladder, adhering to the rectum, pain) - these risks are lower than with a vaginal mesh but still exist - patient desires to continue pessary management at this time

## 2023-12-29 MED ORDER — FLUCONAZOLE 150 MG PO TABS: 150.0000 mg | ORAL_TABLET | Freq: Once | ORAL | 1 refills | Status: AC

## 2023-12-29 NOTE — Addendum Note (Signed)
Addended byWyatt Haste T on: 12/29/2023 04:12 PM   Modules accepted: Orders

## 2024-01-02 ENCOUNTER — Other Ambulatory Visit: Payer: Self-pay

## 2024-01-02 DIAGNOSIS — N898 Other specified noninflammatory disorders of vagina: Secondary | ICD-10-CM

## 2024-01-02 MED ORDER — FLUCONAZOLE 150 MG PO TABS: 150.0000 mg | ORAL_TABLET | Freq: Once | ORAL | 0 refills | Status: AC

## 2024-01-08 ENCOUNTER — Ambulatory Visit (HOSPITAL_COMMUNITY): Payer: 59

## 2024-01-14 ENCOUNTER — Ambulatory Visit (HOSPITAL_COMMUNITY): Payer: 59

## 2024-01-16 ENCOUNTER — Ambulatory Visit: Payer: 59 | Admitting: Obstetrics

## 2024-01-16 ENCOUNTER — Ambulatory Visit (INDEPENDENT_AMBULATORY_CARE_PROVIDER_SITE_OTHER): Payer: 59 | Admitting: Obstetrics

## 2024-01-16 DIAGNOSIS — N8110 Cystocele, unspecified: Secondary | ICD-10-CM

## 2024-01-19 ENCOUNTER — Other Ambulatory Visit: Payer: Self-pay | Admitting: Obstetrics

## 2024-01-19 MED ORDER — FLUCONAZOLE 150 MG PO TABS: 150.0000 mg | ORAL_TABLET | Freq: Once | ORAL | 0 refills | Status: AC

## 2024-01-19 NOTE — Progress Notes (Signed)
 Rx diflucan to [facility] pharmacy.

## 2024-01-22 ENCOUNTER — Ambulatory Visit (HOSPITAL_COMMUNITY): Payer: 59

## 2024-01-26 ENCOUNTER — Other Ambulatory Visit: Payer: Self-pay | Admitting: Internal Medicine

## 2024-02-02 DIAGNOSIS — N8110 Cystocele, unspecified: Secondary | ICD-10-CM | POA: Insufficient documentation

## 2024-02-02 NOTE — Progress Notes (Signed)
 Patient was not seen.

## 2024-02-05 DIAGNOSIS — R051 Acute cough: Secondary | ICD-10-CM | POA: Diagnosis not present

## 2024-02-05 DIAGNOSIS — J209 Acute bronchitis, unspecified: Secondary | ICD-10-CM | POA: Diagnosis not present

## 2024-02-05 DIAGNOSIS — Z6824 Body mass index (BMI) 24.0-24.9, adult: Secondary | ICD-10-CM | POA: Diagnosis not present

## 2024-03-19 ENCOUNTER — Other Ambulatory Visit (HOSPITAL_COMMUNITY): Payer: Self-pay | Admitting: Women's Health

## 2024-03-19 DIAGNOSIS — Z1231 Encounter for screening mammogram for malignant neoplasm of breast: Secondary | ICD-10-CM

## 2024-03-24 ENCOUNTER — Encounter (HOSPITAL_COMMUNITY): Payer: Self-pay

## 2024-03-24 ENCOUNTER — Ambulatory Visit (HOSPITAL_COMMUNITY)
Admission: RE | Admit: 2024-03-24 | Discharge: 2024-03-24 | Disposition: A | Discharge status: Home / Self Care | Source: Ambulatory Visit | Attending: Women's Health | Admitting: Women's Health

## 2024-03-24 DIAGNOSIS — Z1231 Encounter for screening mammogram for malignant neoplasm of breast: Secondary | ICD-10-CM | POA: Diagnosis not present

## 2024-04-16 ENCOUNTER — Encounter: Payer: Self-pay | Admitting: Obstetrics

## 2024-04-16 ENCOUNTER — Ambulatory Visit (INDEPENDENT_AMBULATORY_CARE_PROVIDER_SITE_OTHER): Admitting: Obstetrics

## 2024-04-16 ENCOUNTER — Other Ambulatory Visit (HOSPITAL_COMMUNITY)
Admission: RE | Admit: 2024-04-16 | Discharge: 2024-04-16 | Disposition: A | Discharge status: Home / Self Care | Source: Ambulatory Visit | Attending: Obstetrics | Admitting: Obstetrics

## 2024-04-16 VITALS — BP 98/61 | HR 83

## 2024-04-16 DIAGNOSIS — N3281 Overactive bladder: Secondary | ICD-10-CM | POA: Diagnosis not present

## 2024-04-16 DIAGNOSIS — Z4689 Encounter for fitting and adjustment of other specified devices: Secondary | ICD-10-CM

## 2024-04-16 DIAGNOSIS — N952 Postmenopausal atrophic vaginitis: Secondary | ICD-10-CM

## 2024-04-16 DIAGNOSIS — N898 Other specified noninflammatory disorders of vagina: Secondary | ICD-10-CM | POA: Diagnosis not present

## 2024-04-16 DIAGNOSIS — N814 Uterovaginal prolapse, unspecified: Secondary | ICD-10-CM | POA: Diagnosis not present

## 2024-04-16 DIAGNOSIS — M6289 Other specified disorders of muscle: Secondary | ICD-10-CM

## 2024-04-16 LAB — POCT URINALYSIS DIP (CLINITEK)
Bilirubin, UA: NEGATIVE
Blood, UA: NEGATIVE
Glucose, UA: NEGATIVE mg/dL
Ketones, POC UA: NEGATIVE mg/dL
Leukocytes, UA: NEGATIVE
Nitrite, UA: NEGATIVE
POC PROTEIN,UA: NEGATIVE
Spec Grav, UA: 1.01 (ref 1.010–1.025)
Urobilinogen, UA: 0.2 U/dL
pH, UA: 6.5 (ref 5.0–8.0)

## 2024-04-16 MED ORDER — FLUCONAZOLE 150 MG PO TABS
150.0000 mg | ORAL_TABLET | Freq: Once | ORAL | 0 refills | Status: DC
Start: 2024-04-16 — End: 2024-04-16

## 2024-04-16 MED ORDER — ESTRADIOL 0.1 MG/GM VA CREA: 0.5000 g | TOPICAL_CREAM | VAGINAL | 3 refills | Status: AC

## 2024-04-16 MED ORDER — FLUCONAZOLE 150 MG PO TABS: 150.0000 mg | ORAL_TABLET | Freq: Once | ORAL | 0 refills | Status: AC

## 2024-04-16 NOTE — Progress Notes (Signed)
 [facility] Return Visit  SUBJECTIVE  History of Present Illness: Kayla White is a 53 y.o. female seen in follow-up for overactive bladder and Stage II pelvic organ prolapse. Plan at last visit was trial of size 6 shaatz pessary self management.   Reports feeling puffy around vaginal area for 2-3 weeks. Denies change in diet, medication. Removed pessary 3 days ago and noted thick white vaginal discharge.  Reports cycle 3 weeks ago.  Reports RLQ pain intermittently, did not improve after removal of pessary Prior vaginal irritation attributed to dietary changes  Removes pessary every 3 days - Leaks 2-3x/day previously resolved with initial pessary use. UUI = SUI Prior use of Mirabegron  50mg  with dry eyes and lips, tried oxybutynin and vesicare with dry mouth H/o IBS-C with BM 3x/wk with straining with leakage. H/o glycerin suppositories PRN 2-3x/wk, stool softener and miralax use 1/2 capful daily. H/o amitiza  and linzess  and different fiber supplements.  Continues vaginal estrogen 1g 2x/week  Past Medical History: Patient  has a past medical history of Anxiety, Depression, Eosinophilic esophagitis, GERD (gastroesophageal reflux disease), IBS (irritable bowel syndrome), Migraines, and PONV (postoperative nausea and vomiting).   Past Surgical History: She  has a past surgical history that includes Laparoscopic abdominal exploration; IVF (2007, 099 amd 2010); Dilation and curettage of uterus (2009); Cholecystectomy (N/A, 08/14/2018); Colonoscopy with esophagogastroduodenoscopy (egd) (06/2018); Esophagogastroduodenoscopy (egd) with propofol  (N/A, 10/19/2018); maloney dilation (N/A, 10/19/2018); biopsy (10/19/2018); Colonoscopy with propofol  (N/A, 06/11/2021); Esophagogastroduodenoscopy (egd) with propofol  (N/A, 06/11/2021); and Esophageal dilation (06/11/2021).   Medications: She has a current medication list which includes the following prescription(s): acetaminophen ,  calcium carbonate, estradiol -norethindrone , pantoprazole , sertraline , sucralfate , [START ON 04/19/2024] estradiol , and fluconazole .   Allergies: Patient is allergic to bee venom, egg-derived products, shellfish allergy , and penicillins.   Social History: Patient  reports that she has never smoked. She has never used smokeless tobacco. She reports that she does not drink alcohol and does not use drugs.     OBJECTIVE     Physical Exam: Vitals:   04/16/24 1454  BP: 98/61  Pulse: 83   Physical Exam Constitutional:      General: She is not in acute distress.    Appearance: Normal appearance.  Genitourinary:     Urethral meatus normal.     There are lesions in the vagina.     Genitourinary Comments: 6-71mm fluid filled cysts with no erythema or discharge     Right Labia: No rash, tenderness, lesions, skin changes or Bartholin's cyst.    Left Labia: No tenderness, lesions, skin changes, Bartholin's cyst or rash.    No vaginal discharge, erythema, tenderness, bleeding, ulceration or granulation tissue.     No cervical discharge.     Urethral meatus caruncle not present.    No urethral prolapse, tenderness, mass, hypermobility, discharge or stress urinary incontinence with cough stress test present.     Obturator internus is tender (right sided).     Levator ani not tender and no pelvic spasms present. Cardiovascular:     Rate and Rhythm: Normal rate.  Pulmonary:     Effort: Pulmonary effort is normal. No respiratory distress.  Abdominal:     General: There is no distension.     Palpations: Abdomen is soft. There is no mass.     Tenderness: There is no abdominal tenderness.     Hernia: No hernia is present.  Neurological:     Mental Status: She is alert.  Vitals reviewed. Exam conducted with a chaperone present.  A size 6 shaatz pessary in placed with some descent of anterior vaginal wall during valsalva. Reviewed anatomy with patient via mirror. Imbedded sedimentation noted on  pessary. Pessary removed without difficulty.  A size 7 shaatz pessary was placed without difficulty. It was comfortable, stayed in place with valsalva and was an appropriate size on examination, with one finger fitting between the pessary and the vaginal walls. LOT [id number]      No data to display             ASSESSMENT AND PLAN    Kayla White is a 53 y.o. with:  1. Uterine prolapse   2. Vaginal atrophy   3. Overactive bladder   4. Fitting and adjustment of pessary   5. Vaginal irritation   6. Pelvic floor dysfunction      Uterine prolapse Assessment & Plan: - reviewed stage II pelvic organ prolapse - self manage size 5, 6 shaatz pessary use previously with resolution of bulge and urinary leakage - trial of size 7 shaatz pessary - encouraged to continue fiber supplementation and miralax titration to optimize stool consistency - For treatment of pelvic organ prolapse, we previously discussed options for management including expectant management, conservative management, and surgical management, such as Kegels, a pessary, pelvic floor physical therapy, and specific surgical procedures. We discussed two options for prolapse repair per patient request:  1) vaginal repair without mesh - Pros - safer, no mesh complications - Cons - not as strong as mesh repair, higher risk of recurrence  2) laparoscopic repair with mesh - Pros - stronger, better long-term success - Cons - risks of mesh implant (erosion into vagina or bladder, adhering to the rectum, pain) - these risks are lower than with a vaginal mesh but still exist - patient desires to continue pessary management at this time   Vaginal atrophy Assessment & Plan: - prior sensation of dryness with pessary management - For symptomatic vaginal atrophy options include lubrication with a water -based lubricant, personal hygiene measures and barrier protection against wetness, and estrogen replacement in the form of vaginal  cream, vaginal tablets, or a time-released vaginal ring.   - continue low dose vaginal estrogen, increase to 3x/week - previously provided vaginal lubricant, encouraged moisturizers and Vit E suppository - Rx diflucan  if symptoms persistent or worsens. - prior activella use 1x/week, discontinued due to lack of vasomotor symptoms - pending Nuswab  Orders: -     Estradiol ; Place 0.5 g vaginally 2 (two) times a week. Place 0.5g nightly for two weeks then twice a week after  Dispense: 30 g; Refill: 3  Overactive bladder Assessment & Plan: - repeat UA negative today due to recent increased urinary frequency - symptoms previously resolved with pessary use, reassess after size 7 shaatz pessary - prior use of mirabegron , oxybutynin, and vesicare with dry mouth, consider trial of Gemtesa - encouraged to consider pelvic floor PT - We previously discussed the symptoms of overactive bladder (OAB), which include urinary urgency, urinary frequency, nocturia, with or without urge incontinence.  While we do not know the exact etiology of OAB, several treatment options exist. We discussed management including behavioral therapy (decreasing bladder irritants, urge suppression strategies, timed voids, bladder retraining), physical therapy, medication; for refractory cases posterior tibial nerve stimulation, sacral neuromodulation, and intravesical botulinum toxin injection.  For anticholinergic medications, we discussed the potential side effects of anticholinergics including dry eyes, dry mouth, constipation, cognitive impairment and urinary retention. For Beta-3 agonist medication, we discussed the potential side effect  of elevated blood pressure which is more likely to occur in individuals with uncontrolled hypertension.  Orders: -     POCT URINALYSIS DIP (CLINITEK)  Fitting and adjustment of pessary Assessment & Plan: - trial of self mgmt of size 7 shaatz pessary  - consider size 6 ring with support  pessary if it worsens right sided pelvic floor pain - consider increasing to size 7 ring with support pessary if she continues to report puffy sensation in the vagina - tried size 5 and 6 shaatz pessary - discussed risk of vaginal ulceration, discharge, bleeding, or fistula formulation. Patient to return if change in vaginal discharge or bleeding noted.    Vaginal irritation Assessment & Plan: - bilateral <1cm fluid filled cysts around 2-3 and 9-10 o'clock, reassured patient regarding similar findings - no erythema or sign of infection, mobile, denies pain with palpation but reports irritation - reviewed etiology includes Gartner's duct cyst or Skene's gland cysts. Encouraged pt to monitor size and symptoms, repeat exam at follow-up. - consider MRI if clinical change - Rx diflucan  if symptoms of irritation persist or worsens - pending Nuswab  Orders: -     Cervicovaginal ancillary only  Pelvic floor dysfunction Assessment & Plan: - right sided pelvic pain recreated on exam, denies relief after removal of pessary - encouraged to consider pelvic floor PT - The origin of pelvic floor muscle spasm can be multifactorial, including primary, reactive to a different pain source, trauma, or even part of a centralized pain syndrome.Treatment options include pelvic floor physical therapy, local (vaginal) or oral  muscle relaxants, pelvic muscle trigger point injections or centrally acting pain medications.     Other orders -     Fluconazole ; Take 1 tablet (150 mg total) by mouth once for 1 dose.  Dispense: 1 tablet; Refill: 0  Time spent: I spent 48 minutes dedicated to the care of this patient on the date of this encounter to include pre-visit review of records, face-to-face time with the patient discussing stage II pelvic organ prolapse, OAB, vaginal atrophy, vaginal cyst, vaginal irritation, and post visit documentation and ordering medication/ testing.    Darlene Ehlers, MD

## 2024-04-16 NOTE — Assessment & Plan Note (Signed)
-   reviewed stage II pelvic organ prolapse - self manage size 5, 6 shaatz pessary use previously with resolution of bulge and urinary leakage - trial of size 7 shaatz pessary - encouraged to continue fiber supplementation and miralax titration to optimize stool consistency - For treatment of pelvic organ prolapse, we previously discussed options for management including expectant management, conservative management, and surgical management, such as Kegels, a pessary, pelvic floor physical therapy, and specific surgical procedures. We discussed two options for prolapse repair per patient request:  1) vaginal repair without mesh - Pros - safer, no mesh complications - Cons - not as strong as mesh repair, higher risk of recurrence  2) laparoscopic repair with mesh - Pros - stronger, better long-term success - Cons - risks of mesh implant (erosion into vagina or bladder, adhering to the rectum, pain) - these risks are lower than with a vaginal mesh but still exist - patient desires to continue pessary management at this time

## 2024-04-16 NOTE — Assessment & Plan Note (Signed)
-   prior sensation of dryness with pessary management - For symptomatic vaginal atrophy options include lubrication with a water -based lubricant, personal hygiene measures and barrier protection against wetness, and estrogen replacement in the form of vaginal cream, vaginal tablets, or a time-released vaginal ring.   - continue low dose vaginal estrogen, increase to 3x/week - previously provided vaginal lubricant, encouraged moisturizers and Vit E suppository - Rx diflucan  if symptoms persistent or worsens. - prior activella use 1x/week, discontinued due to lack of vasomotor symptoms - pending Nuswab

## 2024-04-16 NOTE — Assessment & Plan Note (Signed)
-   right sided pelvic pain recreated on exam, denies relief after removal of pessary - encouraged to consider pelvic floor PT - The origin of pelvic floor muscle spasm can be multifactorial, including primary, reactive to a different pain source, trauma, or even part of a centralized pain syndrome.Treatment options include pelvic floor physical therapy, local (vaginal) or oral  muscle relaxants, pelvic muscle trigger point injections or centrally acting pain medications.

## 2024-04-16 NOTE — Assessment & Plan Note (Signed)
-   bilateral <1cm fluid filled cysts around 2-3 and 9-10 o'clock, reassured patient regarding similar findings - no erythema or sign of infection, mobile, denies pain with palpation but reports irritation - reviewed etiology includes Gartner's duct cyst or Skene's gland cysts. Encouraged pt to monitor size and symptoms, repeat exam at follow-up. - consider MRI if clinical change - Rx diflucan  if symptoms of irritation persist or worsens - pending Nuswab

## 2024-04-16 NOTE — Patient Instructions (Addendum)
 Continue vaginal estrogen 1g twice a week.   We will try a size 7 shaatz pessary, continue to remove as often as you would like for cleaning.  Please return for pessary check if you experience change in vaginal or urinary symptoms.   We will contact you regarding vaginal and urinary infection.   You can start diflucan  if you continue to experience vaginal irritation.

## 2024-04-16 NOTE — Assessment & Plan Note (Addendum)
-   repeat UA negative today due to recent increased urinary frequency - symptoms previously resolved with pessary use, reassess after size 7 shaatz pessary - prior use of mirabegron , oxybutynin, and vesicare with dry mouth, consider trial of Gemtesa - encouraged to consider pelvic floor PT - We previously discussed the symptoms of overactive bladder (OAB), which include urinary urgency, urinary frequency, nocturia, with or without urge incontinence.  While we do not know the exact etiology of OAB, several treatment options exist. We discussed management including behavioral therapy (decreasing bladder irritants, urge suppression strategies, timed voids, bladder retraining), physical therapy, medication; for refractory cases posterior tibial nerve stimulation, sacral neuromodulation, and intravesical botulinum toxin injection.  For anticholinergic medications, we discussed the potential side effects of anticholinergics including dry eyes, dry mouth, constipation, cognitive impairment and urinary retention. For Beta-3 agonist medication, we discussed the potential side effect of elevated blood pressure which is more likely to occur in individuals with uncontrolled hypertension.

## 2024-04-16 NOTE — Assessment & Plan Note (Signed)
-   trial of self mgmt of size 7 shaatz pessary  - consider size 6 ring with support pessary if it worsens right sided pelvic floor pain - consider increasing to size 7 ring with support pessary if she continues to report puffy sensation in the vagina - tried size 5 and 6 shaatz pessary - discussed risk of vaginal ulceration, discharge, bleeding, or fistula formulation. Patient to return if change in vaginal discharge or bleeding noted.

## 2024-04-20 ENCOUNTER — Ambulatory Visit: Payer: Self-pay | Admitting: Obstetrics

## 2024-04-20 LAB — CERVICOVAGINAL ANCILLARY ONLY
Bacterial Vaginitis (gardnerella): NEGATIVE
Candida Glabrata: NEGATIVE
Candida Vaginitis: POSITIVE — AB
Comment: NEGATIVE
Comment: NEGATIVE
Comment: NEGATIVE

## 2024-05-12 ENCOUNTER — Other Ambulatory Visit: Payer: Self-pay | Admitting: Women's Health

## 2024-07-07 DIAGNOSIS — K2100 Gastro-esophageal reflux disease with esophagitis, without bleeding: Secondary | ICD-10-CM | POA: Diagnosis not present

## 2024-07-07 DIAGNOSIS — K5900 Constipation, unspecified: Secondary | ICD-10-CM | POA: Diagnosis not present

## 2024-07-07 DIAGNOSIS — Z6825 Body mass index (BMI) 25.0-25.9, adult: Secondary | ICD-10-CM | POA: Diagnosis not present

## 2024-07-07 DIAGNOSIS — K200 Eosinophilic esophagitis: Secondary | ICD-10-CM | POA: Diagnosis not present

## 2024-07-22 ENCOUNTER — Ambulatory Visit: Admitting: Obstetrics

## 2024-07-22 NOTE — Progress Notes (Deleted)
 [facility] Return Visit  SUBJECTIVE  History of Present Illness: Kayla White is a 53 y.o. female seen in follow-up for overactive bladder and Stage II pelvic organ prolapse. Plan at last visit was trial of size 6 shaatz pessary self management.   Reports feeling puffy around vaginal area for 2-3 weeks. Denies change in diet, medication. Removed pessary 3 days ago and noted thick white vaginal discharge.  Reports cycle 3 weeks ago.  Reports RLQ pain intermittently, did not improve after removal of pessary Prior vaginal irritation attributed to dietary changes  Removes pessary every 3 days - Leaks 2-3x/day previously resolved with initial pessary use. UUI = SUI Prior use of Mirabegron  50mg  with dry eyes and lips, tried oxybutynin and vesicare with dry mouth H/o IBS-C with BM 3x/wk with straining with leakage. H/o glycerin suppositories PRN 2-3x/wk, stool softener and miralax use 1/2 capful daily. H/o amitiza  and linzess  and different fiber supplements.  Continues vaginal estrogen 1g 2x/week  Past Medical History: Patient  has a past medical history of Anxiety, Depression, Eosinophilic esophagitis, GERD (gastroesophageal reflux disease), IBS (irritable bowel syndrome), Migraines, and PONV (postoperative nausea and vomiting).   Past Surgical History: She  has a past surgical history that includes Laparoscopic abdominal exploration; IVF (2007, 099 amd 2010); Dilation and curettage of uterus (2009); Cholecystectomy (N/A, 08/14/2018); Colonoscopy with esophagogastroduodenoscopy (egd) (06/2018); Esophagogastroduodenoscopy (egd) with propofol  (N/A, 10/19/2018); maloney dilation (N/A, 10/19/2018); biopsy (10/19/2018); Colonoscopy with propofol  (N/A, 06/11/2021); Esophagogastroduodenoscopy (egd) with propofol  (N/A, 06/11/2021); and Esophageal dilation (06/11/2021).   Medications: She has a current medication list which includes the following prescription(s): acetaminophen ,  calcium carbonate, estradiol , estradiol -norethindrone , pantoprazole , sertraline , and sucralfate .   Allergies: Patient is allergic to bee venom, egg-derived products, shellfish allergy , and penicillins.   Social History: Patient  reports that she has never smoked. She has never used smokeless tobacco. She reports that she does not drink alcohol and does not use drugs.     OBJECTIVE     Physical Exam: There were no vitals filed for this visit.  Physical Exam Constitutional:      General: She is not in acute distress.    Appearance: Normal appearance.  Genitourinary:     Urethral meatus normal.     There are lesions in the vagina.     Genitourinary Comments: 6-24mm fluid filled cysts with no erythema or discharge     Right Labia: No rash, tenderness, lesions, skin changes or Bartholin's cyst.    Left Labia: No tenderness, lesions, skin changes, Bartholin's cyst or rash.    No vaginal discharge, erythema, tenderness, bleeding, ulceration or granulation tissue.     No cervical discharge.     Urethral meatus caruncle not present.    No urethral prolapse, tenderness, mass, hypermobility, discharge or stress urinary incontinence with cough stress test present.     Obturator internus is tender (right sided).     Levator ani not tender and no pelvic spasms present. Cardiovascular:     Rate and Rhythm: Normal rate.  Pulmonary:     Effort: Pulmonary effort is normal. No respiratory distress.  Abdominal:     General: There is no distension.     Palpations: Abdomen is soft. There is no mass.     Tenderness: There is no abdominal tenderness.     Hernia: No hernia is present.  Neurological:     Mental Status: She is alert.  Vitals reviewed. Exam conducted with a chaperone present.   A size 6 shaatz pessary in  placed with some descent of anterior vaginal wall during valsalva. Reviewed anatomy with patient via mirror. Imbedded sedimentation noted on pessary. Pessary removed without  difficulty.  A size 7 shaatz pessary was placed without difficulty. It was comfortable, stayed in place with valsalva and was an appropriate size on examination, with one finger fitting between the pessary and the vaginal walls. LOT [id number]      No data to display             ASSESSMENT AND PLAN    Kayla White is a 53 y.o. with:  No diagnosis found.    There are no diagnoses linked to this encounter. Time spent: I spent 48 minutes dedicated to the care of this patient on the date of this encounter to include pre-visit review of records, face-to-face time with the patient discussing stage II pelvic organ prolapse, OAB, vaginal atrophy, vaginal cyst, vaginal irritation, and post visit documentation and ordering medication/ testing.    Lianne ONEIDA Gillis, MD

## 2024-08-30 ENCOUNTER — Encounter: Payer: Self-pay | Admitting: Women's Health

## 2024-08-30 ENCOUNTER — Other Ambulatory Visit (HOSPITAL_COMMUNITY)
Admission: RE | Admit: 2024-08-30 | Discharge: 2024-08-30 | Disposition: A | Source: Ambulatory Visit | Attending: Women's Health | Admitting: Women's Health

## 2024-08-30 ENCOUNTER — Ambulatory Visit (INDEPENDENT_AMBULATORY_CARE_PROVIDER_SITE_OTHER): Admitting: Women's Health

## 2024-08-30 VITALS — BP 100/63 | HR 69 | Ht 68.2 in | Wt 166.0 lb

## 2024-08-30 DIAGNOSIS — N951 Menopausal and female climacteric states: Secondary | ICD-10-CM

## 2024-08-30 DIAGNOSIS — R102 Pelvic and perineal pain unspecified side: Secondary | ICD-10-CM

## 2024-08-30 DIAGNOSIS — Z8742 Personal history of other diseases of the female genital tract: Secondary | ICD-10-CM | POA: Diagnosis not present

## 2024-08-30 DIAGNOSIS — Z01419 Encounter for gynecological examination (general) (routine) without abnormal findings: Secondary | ICD-10-CM | POA: Diagnosis not present

## 2024-08-30 DIAGNOSIS — Z1321 Encounter for screening for nutritional disorder: Secondary | ICD-10-CM

## 2024-08-30 DIAGNOSIS — Z1331 Encounter for screening for depression: Secondary | ICD-10-CM

## 2024-08-30 DIAGNOSIS — Z131 Encounter for screening for diabetes mellitus: Secondary | ICD-10-CM

## 2024-08-30 DIAGNOSIS — Z1151 Encounter for screening for human papillomavirus (HPV): Secondary | ICD-10-CM | POA: Diagnosis not present

## 2024-08-30 DIAGNOSIS — Z1329 Encounter for screening for other suspected endocrine disorder: Secondary | ICD-10-CM | POA: Diagnosis not present

## 2024-08-30 MED ORDER — FLUCONAZOLE 150 MG PO TABS: 150.0000 mg | ORAL_TABLET | Freq: Once | ORAL | 0 refills | Status: AC

## 2024-08-30 NOTE — Progress Notes (Signed)
 WELL-WOMAN EXAMINATION Patient name: Kayla White MRN [id number]  Date of birth: December 02, 1970 Chief Complaint:   Gynecologic Exam (Last pap 05-26-23 normal)  History of Present Illness:   Kayla White is a 53 y.o. [phone] Caucasian female being seen today for a routine well-woman exam.  Current complaints: pessary working well, thinks she has yeast infection- had thick clumpy d/c when took it out the other day. No itching/irritation, requests diflucan  rx.  Constipation, PCP rx'd amitiza  but insurance denied Intermittent bilateral lower pelvic pain, pt has concern for ovarian cancer d/t having years of infertility tx in past, requests pelvic u/s  PCP: [facility]      does desire labs, fasting today Patient's last menstrual period was 06/25/2024. The current method of family planning is vasectomy.  Last pap 05/26/23. Results were: NILM w/ HRHPV negative. H/O abnormal pap: no Last mammogram: 03/24/24. Results were: normal. Family h/o breast cancer: no Last colonoscopy: 06/11/21. Results were: normal. Family h/o colorectal cancer: no     08/30/2024    8:46 AM 05/26/2023   10:27 AM 04/02/2022    3:38 PM  Depression screen PHQ 2/9  Decreased Interest 0 2 1  Down, Depressed, Hopeless 0 1 1  PHQ - 2 Score 0 3 2  Altered sleeping 0 1 1  Tired, decreased energy 2 3 1   Change in appetite 1 1 0  Feeling bad or failure about yourself  0 0 1  Trouble concentrating 2 2 1   Moving slowly or fidgety/restless 0 2 0  Suicidal thoughts 0 0 0  PHQ-9 Score 5 12 6         08/30/2024    8:47 AM 05/26/2023   10:28 AM 04/02/2022    3:38 PM  GAD 7 : Generalized Anxiety Score  Nervous, Anxious, on Edge 0 1 1  Control/stop worrying 0 0 1  Worry too much - different things 0 0 1  Trouble relaxing 1 1 1   Restless 0 0 1  Easily annoyed or irritable 1 2 1   Afraid - awful might happen 0 1 0  Total GAD 7 Score 2 5 6      Review of Systems:   Pertinent items are noted in HPI Denies any  headaches, blurred vision, fatigue, shortness of breath, chest pain, abdominal pain, abnormal vaginal discharge/itching/odor/irritation, problems with periods, bowel movements, urination, or intercourse unless otherwise stated above. Pertinent History Reviewed:  Reviewed past medical,surgical, social and family history.  Reviewed problem list, medications and allergies. Physical Assessment:   Vitals:   08/30/24 0839  BP: 100/63  Pulse: 69  Weight: 166 lb (75.3 kg)  Height: 5' 8.2 (1.732 m)  Body mass index is 25.09 kg/m.        Physical Examination:   General appearance - well appearing, and in no distress  Mental status - alert, oriented to person, place, and time  Psych:  She has a normal mood and affect  Skin - warm and dry, normal color, no suspicious lesions noted  Chest - effort normal, all lung fields clear to auscultation bilaterally  Heart - normal rate and regular rhythm  Neck:  midline trachea, no thyromegaly or nodules  Breasts - breasts appear normal, no suspicious masses, no skin or nipple changes or  axillary nodes  Abdomen - soft, nontender, nondistended, no masses or organomegaly  Pelvic - VULVA: normal appearing vulva with no masses, tenderness or lesions  VAGINA: normal appearing vagina with normal color and discharge, no lesions  CERVIX: normal appearing cervix without  discharge or lesions, no CMT  Thin prep pap is done w/ HR HPV cotesting per pt request  UTERUS: uterus is felt to be normal size, shape, consistency and nontender   ADNEXA: No adnexal masses or tenderness noted.  Extremities:  No swelling or varicosities noted  Chaperone: Kayla White  No results found for this or any previous visit (from the past 24 hours).  Assessment & Plan:  1) Well-Woman Exam  2) Pelvic organ prolapse/UI> being followed by uro/gyn, doing well w/ pessary  3) Thick clumpy vaginal d/c> rx diflucan  per request for presumed yeast infection  4) Constipation> PCP rx'd amitiza   but insurance denied (coded wrong per pt), recommended making f/u w/ GI   5) Intermittent lower pelvic pain w/ h/o years of fertility treatment> pt w/ concerns for ovarian cancer, pelvic u/s per pt request-order placed and note routed to [place] to schedule   Labs/procedures today: pap, labs  Mammogram: in April, or sooner if problems Colonoscopy: per GI, or sooner if problems  Orders Placed This Encounter  Procedures   US  PELVIC COMPLETE WITH TRANSVAGINAL   CBC   Comprehensive metabolic panel with GFR   TSH   Lipid panel   Hemoglobin A1c   VITAMIN D  25 Hydroxy (Vit-D Deficiency, Fractures)   FSH    Meds:  Meds ordered this encounter  Medications   fluconazole  (DIFLUCAN ) 150 MG tablet    Sig: Take 1 tablet (150 mg total) by mouth once for 1 dose. Take 1 pill now, may take 2nd pill in 3 days if needed    Dispense:  2 tablet    Refill:  0    Follow-up: Return in about 1 year (around 08/30/2025) for Physical.  Kayla White CNM, WHNP-BC 08/30/2024 11:35 AM

## 2024-08-31 ENCOUNTER — Ambulatory Visit: Payer: Self-pay | Admitting: Women's Health

## 2024-08-31 LAB — COMPREHENSIVE METABOLIC PANEL WITH GFR
ALT: 14 IU/L (ref 0–32)
AST: 20 IU/L (ref 0–40)
Albumin: 4.3 g/dL (ref 3.8–4.9)
Alkaline Phosphatase: 49 IU/L (ref 49–135)
BUN/Creatinine Ratio: 19 (ref 9–23)
BUN: 18 mg/dL (ref 6–24)
Bilirubin Total: 0.4 mg/dL (ref 0.0–1.2)
CO2: 24 mmol/L (ref 20–29)
Calcium: 9 mg/dL (ref 8.7–10.2)
Chloride: 103 mmol/L (ref 96–106)
Creatinine, Ser: 0.94 mg/dL (ref 0.57–1.00)
Globulin, Total: 2 g/dL (ref 1.5–4.5)
Glucose: 91 mg/dL (ref 70–99)
Potassium: 4.6 mmol/L (ref 3.5–5.2)
Sodium: 139 mmol/L (ref 134–144)
Total Protein: 6.3 g/dL (ref 6.0–8.5)
eGFR: 73 mL/min/1.73 (ref >59)

## 2024-08-31 LAB — CYTOLOGY - PAP
Comment: NEGATIVE
Diagnosis: NEGATIVE
High risk HPV: NEGATIVE

## 2024-08-31 LAB — FOLLICLE STIMULATING HORMONE: FSH: 42.2 m[IU]/mL

## 2024-08-31 LAB — HEMOGLOBIN A1C
Est. average glucose Bld gHb Est-mCnc: 103 mg/dL
Hgb A1c MFr Bld: 5.2 % (ref 4.8–5.6)

## 2024-08-31 LAB — LIPID PANEL
Chol/HDL Ratio: 2.8 ratio (ref 0.0–4.4)
Cholesterol, Total: 165 mg/dL (ref 100–199)
HDL: 58 mg/dL (ref >39)
LDL Chol Calc (NIH): 97 mg/dL (ref 0–99)
Triglycerides: 47 mg/dL (ref 0–149)
VLDL Cholesterol Cal: 10 mg/dL (ref 5–40)

## 2024-08-31 LAB — CBC
Hematocrit: 40.1 % (ref 34.0–46.6)
Hemoglobin: 13.3 g/dL (ref 11.1–15.9)
MCH: 29.1 pg (ref 26.6–33.0)
MCHC: 33.2 g/dL (ref 31.5–35.7)
MCV: 88 fL (ref 79–97)
Platelets: 151 x10E3/uL (ref 150–450)
RBC: 4.57 x10E6/uL (ref 3.77–5.28)
RDW: 12.7 % (ref 11.7–15.4)
WBC: 3.7 x10E3/uL (ref 3.4–10.8)

## 2024-08-31 LAB — VITAMIN D 25 HYDROXY (VIT D DEFICIENCY, FRACTURES): Vit D, 25-Hydroxy: 33.1 ng/mL (ref 30.0–100.0)

## 2024-08-31 LAB — TSH: TSH: 2.42 u[IU]/mL (ref 0.450–4.500)

## 2024-09-13 ENCOUNTER — Other Ambulatory Visit: Payer: Self-pay | Admitting: Internal Medicine

## 2024-09-13 NOTE — Telephone Encounter (Signed)
 Please arrange non urgent follow up by end of year.

## 2024-10-26 ENCOUNTER — Ambulatory Visit: Admitting: Internal Medicine

## 2024-12-06 ENCOUNTER — Encounter: Payer: Self-pay | Admitting: *Deleted

## 2024-12-28 ENCOUNTER — Encounter: Payer: Self-pay | Admitting: *Deleted

## 2024-12-28 ENCOUNTER — Encounter: Payer: Self-pay | Admitting: Internal Medicine

## 2024-12-28 ENCOUNTER — Ambulatory Visit: Admitting: Internal Medicine

## 2024-12-28 ENCOUNTER — Telehealth: Payer: Self-pay | Admitting: *Deleted

## 2024-12-28 VITALS — BP 104/66 | HR 65 | Temp 98.4°F | Ht 69.0 in | Wt 171.4 lb

## 2024-12-28 DIAGNOSIS — K219 Gastro-esophageal reflux disease without esophagitis: Secondary | ICD-10-CM

## 2024-12-28 DIAGNOSIS — K200 Eosinophilic esophagitis: Secondary | ICD-10-CM

## 2024-12-28 DIAGNOSIS — R1310 Dysphagia, unspecified: Secondary | ICD-10-CM

## 2024-12-28 DIAGNOSIS — K5909 Other constipation: Secondary | ICD-10-CM

## 2024-12-28 MED ORDER — IBSRELA 50 MG PO TABS: 50.0000 mg | ORAL_TABLET | Freq: Two times a day (BID) | ORAL | Status: AC

## 2024-12-28 MED ORDER — PEG 3350-KCL-NA BICARB-NACL 420 G PO SOLR: 4000.0000 mL | Freq: Once | ORAL | 0 refills | Status: AC

## 2024-12-30 ENCOUNTER — Ambulatory Visit (HOSPITAL_COMMUNITY): Admitting: Anesthesiology

## 2024-12-30 ENCOUNTER — Encounter (HOSPITAL_COMMUNITY): Admission: RE | Disposition: A | Payer: Self-pay | Attending: Internal Medicine

## 2024-12-30 ENCOUNTER — Ambulatory Visit (HOSPITAL_COMMUNITY)
Admission: RE | Admit: 2024-12-30 | Discharge: 2024-12-30 | Disposition: A | Attending: Internal Medicine | Admitting: Internal Medicine

## 2024-12-30 ENCOUNTER — Other Ambulatory Visit: Payer: Self-pay

## 2024-12-30 ENCOUNTER — Encounter (HOSPITAL_COMMUNITY): Payer: Self-pay | Admitting: Internal Medicine

## 2024-12-30 MED ORDER — LIDOCAINE HCL (CARDIAC) PF 100 MG/5ML IV SOSY
PREFILLED_SYRINGE | INTRAVENOUS | Status: DC | PRN
  Administered 2024-12-30: 50 mg via INTRAVENOUS

## 2024-12-30 MED ORDER — PROPOFOL 10 MG/ML IV BOLUS
INTRAVENOUS | Status: DC | PRN
  Administered 2024-12-30: 100 mg via INTRAVENOUS
  Administered 2024-12-30: 200 ug/kg/min via INTRAVENOUS

## 2024-12-30 MED ORDER — LACTATED RINGERS IV SOLN: INTRAVENOUS | Status: DC

## 2024-12-30 MED ORDER — PHENYLEPHRINE HCL (PRESSORS) 10 MG/ML IV SOLN
INTRAVENOUS | Status: DC | PRN
  Administered 2024-12-30: 80 ug via INTRAVENOUS

## 2024-12-30 NOTE — Op Note (Signed)
 [facility] Patient Name: Kayla White Procedure Date: 12/30/2024 12:12 PM MRN: [id number] Date of Birth: 1971/05/10 Attending MD: Lamar Ozell Hollingshead , MD, [id number] CSN: [id number] Age: 54 Admit Type: Outpatient Procedure:                Colonoscopy Indications:              Heme positive stool Providers:                Lamar Ozell Hollingshead, MD, Rosina Sprague, Bascom Blush Referring MD:              Medicines:                Propofol  per Anesthesia Complications:            No immediate complications. Estimated Blood Loss:     Estimated blood loss: none. Procedure:                Pre-Anesthesia Assessment:                           - Prior to the procedure, a History and Physical                            was performed, and patient medications and                            allergies were reviewed. The patient's tolerance of                            previous anesthesia was also reviewed. The risks                            and benefits of the procedure and the sedation                            options and risks were discussed with the patient.                            All questions were answered, and informed consent                            was obtained. Prior Anticoagulants: The patient has                            taken no anticoagulant or antiplatelet agents. ASA                            Grade Assessment: III - A patient with severe                            systemic disease. After reviewing the risks and                            benefits, the patient was deemed in satisfactory  condition to undergo the procedure.                           After obtaining informed consent, the colonoscope                            was passed under direct vision. Throughout the                            procedure, the patient's blood pressure, pulse, and                            oxygen saturations were monitored continuously. The                             [id number] ([id number]) Colon was introduced through                            the anus and advanced to the the cecum, identified                            by appendiceal orifice and ileocecal valve. The                            colonoscopy was performed without difficulty. The                            patient tolerated the procedure well. The quality                            of the bowel preparation was adequate. The entire                            colon was well visualized. Scope In: 1:34:21 PM Scope Out: 1:51:57 PM Scope Withdrawal Time: 0 hours 8 minutes 26 seconds  Total Procedure Duration: 0 hours 17 minutes 36 seconds  Findings:      The perianal and digital rectal examinations were normal. Few scattered       left-sided diverticula. Redundant colon requiring external abdominal       pressure to reach the cecum.      The colon (entire examined portion) appeared normal.      Non-bleeding internal hemorrhoids were found during retroflexion. The       hemorrhoids were mild, small and Grade I (internal hemorrhoids that do       not prolapse).      The exam was otherwise without abnormality on direct and retroflexion       views. Impression:               - The entire examined colon is normal.                           - Non-bleeding internal hemorrhoids. Minimal                            left-sided diverticulosis. Redundant colon.                           -  The examination was otherwise normal on direct                            and retroflexion views.                           - No specimens collected. Cannot exclude an element                            of functional obstruction secondary to pelvic                            descent versus pelvic floor dyssynergy. Moderate Sedation:      Moderate (conscious) sedation was personally administered by an       anesthesia professional. The following parameters were monitored: oxygen       saturation,  heart rate, blood pressure, respiratory rate, EKG, adequacy       of pulmonary ventilation, and response to care. Recommendation:           - Patient has a contact number available for                            emergencies. The signs and symptoms of potential                            delayed complications were discussed with the                            patient. Return to normal activities tomorrow.                            Written discharge instructions were provided to the                            patient.                           - Advance diet as tolerated.                           - Continue present medications. Continue Ibsrela                             until samples run out and then call me and let me                            know how it works for your constipation.                           - Repeat colonoscopy in 10 years for screening                            purposes.                           - Return  to GI office in 6 weeks. See EGD report. Procedure Code(s):        --- Professional ---                           [phone], Colonoscopy, flexible; diagnostic, including                            collection of specimen(s) by brushing or washing,                            when performed (separate procedure) Diagnosis Code(s):        --- Professional ---                           K64.0, First degree hemorrhoids                           R19.5, Other fecal abnormalities CPT copyright 2022 [employer]. All rights reserved. The codes documented in this report are preliminary and upon coder review may  be revised to meet current compliance requirements. Lamar HERO. Syeda Prickett, MD Lamar Ozell Hollingshead, MD 12/30/2024 2:13:47 PM This report has been signed electronically. Number of Addenda: 0

## 2024-12-30 NOTE — Anesthesia Preprocedure Evaluation (Signed)
"                                    Anesthesia Evaluation  Patient identified by MRN, date of birth, ID band Patient awake    Reviewed: Allergy  & Precautions, H&P , NPO status , Patient's Chart, lab work & pertinent test results, reviewed documented beta blocker date and time   History of Anesthesia Complications (+) PONV and history of anesthetic complications  Airway Mallampati: II  TM Distance: >3 FB Neck ROM: full    Dental no notable dental hx.    Pulmonary neg pulmonary ROS   Pulmonary exam normal breath sounds clear to auscultation       Cardiovascular Exercise Tolerance: Good hypertension, negative cardio ROS  Rhythm:regular Rate:Normal     Neuro/Psych  Headaches PSYCHIATRIC DISORDERS Anxiety Depression     Neuromuscular disease    GI/Hepatic Neg liver ROS,GERD  ,,  Endo/Other  negative endocrine ROS    Renal/GU negative Renal ROS  negative genitourinary   Musculoskeletal   Abdominal   Peds  Hematology negative hematology ROS (+)   Anesthesia Other Findings   Reproductive/Obstetrics negative OB ROS                              Anesthesia Physical Anesthesia Plan  ASA: 2  Anesthesia Plan: MAC   Post-op Pain Management:    Induction:   PONV Risk Score and Plan: Propofol  infusion  Airway Management Planned:   Additional Equipment:   Intra-op Plan:   Post-operative Plan:   Informed Consent: I have reviewed the patients History and Physical, chart, labs and discussed the procedure including the risks, benefits and alternatives for the proposed anesthesia with the patient or authorized representative who has indicated his/her understanding and acceptance.     Dental Advisory Given  Plan Discussed with: CRNA  Anesthesia Plan Comments:         Anesthesia Quick Evaluation  "

## 2024-12-30 NOTE — Op Note (Signed)
 [facility] Patient Name: Kayla White Procedure Date: 12/30/2024 12:17 PM MRN: [id number] Date of Birth: [birth year] Attending MD: Lamar Ozell Hollingshead , MD, [id number] CSN: [id number] Age: 54 Admit Type: Outpatient Procedure:                Upper GI endoscopy Indications:              Dysphagia Providers:                Lamar Ozell Hollingshead, MD, Rosina Sprague, Bascom Blush Referring MD:              Medicines:                Propofol  per Anesthesia Complications:            No immediate complications. Estimated Blood Loss:     Estimated blood loss was minimal. Procedure:                Pre-Anesthesia Assessment:                           - Prior to the procedure, a History and Physical                            was performed, and patient medications and                            allergies were reviewed. The patient's tolerance of                            previous anesthesia was also reviewed. The risks                            and benefits of the procedure and the sedation                            options and risks were discussed with the patient.                            All questions were answered, and informed consent                            was obtained. Prior Anticoagulants: The patient has                            taken no anticoagulant or antiplatelet agents. ASA                            Grade Assessment: II - A patient with mild systemic                            disease. After reviewing the risks and benefits,                            the patient was deemed in satisfactory condition to  undergo the procedure.                           After obtaining informed consent, the endoscope was                            passed under direct vision. Throughout the                            procedure, the patient's blood pressure, pulse, and                            oxygen saturations were monitored continuously. The                             [id number] ([id number]) Upper was introduced through                            the mouth, and advanced to the second part of                            duodenum. Scope In: 1:09:23 PM Scope Out: 1:28:23 PM Total Procedure Duration: 0 hours 19 minutes 0 seconds  Findings:      A moderate Schatzki ring was found at the gastroesophageal junction. No       tumor no Barrett's epithelium no reflux esophagitis. Subtle longitudinal       furrows involving the esophageal body. Scope passed through the tubular       esophagus without impediment.      The duodenal bulb and second portion of the duodenum were normal.      The entire examined stomach was normal except for a 5 mm pedunculated       polyp on the gastric body the scope was withdrawn. Dilation was       performed with a Maloney dilator with mild resistance at 54 Fr. The       dilation site was examined following endoscope reinsertion and showed no       change. The scope was withdrawn. Dilation was performed with a Maloney       dilator with mild resistance at 56 Fr. The dilation site was examined       following endoscope reinsertion and showed no change. Estimated blood       loss: none. Subsequently, the cold biopsy forceps was utilized to       disrupt the ring with four-quadrant bites. This was done effectively       without apparent complication. Following biopsy disruption, the gastric       polyp was removed with 1 pass cold biopsy forceps. Finally, the       esophagus was biopsied distally and mid body for histologic study. Impression:               - Moderate Schatzki ring. Dilated/disrupted. Status                            post mid and distal esophageal biopsy                           -  Gastric polyp-removed with cold biopsy forceps                           - Normal duodenal bulb and second portion of the                            duodenum.                           . Moderate Sedation:      Moderate  (conscious) sedation was personally administered by an       anesthesia professional. The following parameters were monitored: oxygen       saturation, heart rate, blood pressure, respiratory rate, EKG, adequacy       of pulmonary ventilation, and response to care. Recommendation:           - Patient has a contact number available for                            emergencies. The signs and symptoms of potential                            delayed complications were discussed with the                            patient. Return to normal activities tomorrow.                            Written discharge instructions were provided to the                            patient.                           - Advance diet as tolerated.                           - Continue present medications. Continue Protonix                             40 mg twice daily.                           - Return to my office in 6 weeks. See colonoscopy                            report. Procedure Code(s):        --- Professional ---                           [phone], Esophagogastroduodenoscopy, flexible,                            transoral; diagnostic, including collection of                            specimen(s) by brushing or washing, when performed                            (  separate procedure)                           43450, Dilation of esophagus, by unguided sound or                            bougie, single or multiple passes Diagnosis Code(s):        --- Professional ---                           K22.2, Esophageal obstruction                           R13.10, Dysphagia, unspecified CPT copyright 2022 [employer]. All rights reserved. The codes documented in this report are preliminary and upon coder review may  be revised to meet current compliance requirements. Lamar HERO. Andrey Hoobler, MD Lamar Ozell Hollingshead, MD 12/30/2024 2:04:12 PM This report has been signed electronically. Number of Addenda: 0

## 2024-12-30 NOTE — Interval H&P Note (Signed)
 History and Physical Interval Note:  12/30/2024 1:03 PM  Kayla White  has presented today for surgery, with the diagnosis of heme positive stool, change in bowel habits.dysphagia.  The various methods of treatment have been discussed with the patient and family. After consideration of risks, benefits and other options for treatment, the patient has consented to  Procedures with comments: COLONOSCOPY (N/A) - 1:00 pm, asa 2 EGD (ESOPHAGOGASTRODUODENOSCOPY) (N/A) DILATION, ESOPHAGUS (N/A) as a surgical intervention.  The patient's history has been reviewed, patient examined, no change in status, stable for surgery.  I have reviewed the patient's chart and labs.  Questions were answered to the patient's satisfaction.     Thula Stewart   no change.  EGD with EGD biopsy excetra for dysphagia.  And diagnostic colonoscopy for Hemoccult positive stool  The risks, benefits, limitations, imponderables and alternatives regarding both EGD and colonoscopy have been reviewed with the patient. Questions have been answered. All parties agreeable.

## 2024-12-30 NOTE — Transfer of Care (Signed)
 Immediate Anesthesia Transfer of Care Note  Patient: Kayla White  Procedure(s) Performed: COLONOSCOPY EGD (ESOPHAGOGASTRODUODENOSCOPY) DILATION, ESOPHAGUS  Patient Location: Endoscopy Unit  Anesthesia Type:MAC  Level of Consciousness: drowsy, patient cooperative, and responds to stimulation  Airway & Oxygen Therapy: Patient Spontanous Breathing  Post-op Assessment: Report given to RN and Post -op Vital signs reviewed and stable  Post vital signs: Reviewed and stable  Last Vitals:  Vitals Value Taken Time  BP 82/38 12/30/24 13:54  Temp 36.4 C 12/30/24 13:54  Pulse 60 12/30/24 13:54  Resp 16 12/30/24 13:54  SpO2 96 % 12/30/24 13:54    Last Pain:  Vitals:   12/30/24 1354  TempSrc: Axillary  PainSc:       Patients Stated Pain Goal: 7 (12/30/24 1132)  Complications: No notable events documented.

## 2024-12-30 NOTE — Anesthesia Postprocedure Evaluation (Signed)
"   Anesthesia Post Note  Patient: Kayla White  Procedure(s) Performed: COLONOSCOPY DILATION, ESOPHAGUS EGD (ESOPHAGOGASTRODUODENOSCOPY)  Patient location during evaluation: Phase II Anesthesia Type: MAC Level of consciousness: awake Pain management: pain level controlled Vital Signs Assessment: post-procedure vital signs reviewed and stable Respiratory status: spontaneous breathing and respiratory function stable Cardiovascular status: blood pressure returned to baseline and stable Postop Assessment: no headache and no apparent nausea or vomiting Anesthetic complications: no Comments: Late entry   No notable events documented.   Last Vitals:  Vitals:   12/30/24 1404 12/30/24 1407  BP: (!) 87/43 (!) 103/53  Pulse: 66 70  Resp: 14 12  Temp:    SpO2: 99% 100%    Last Pain:  Vitals:   12/30/24 1407  TempSrc:   PainSc: 0-No pain                 Yvonna PARAS Flynn Gwyn      "

## 2024-12-30 NOTE — Discharge Instructions (Addendum)
 EGD Discharge instructions Please read the instructions outlined below and refer to this sheet in the next few weeks. These discharge instructions provide you with general information on caring for yourself after you leave the hospital. Your doctor may also give you specific instructions. While your treatment has been planned according to the most current medical practices available, unavoidable complications occasionally occur. If you have any problems or questions after discharge, please call your doctor. ACTIVITY You may resume your regular activity but move at a slower pace for the next 24 hours.  Take frequent rest periods for the next 24 hours.  Walking will help expel (get rid of) the air and reduce the bloated feeling in your abdomen.  No driving for 24 hours (because of the anesthesia (medicine) used during the test).  You may shower.  Do not sign any important legal documents or operate any machinery for 24 hours (because of the anesthesia used during the test).  NUTRITION Drink plenty of fluids.  You may resume your normal diet.  Begin with a light meal and progress to your normal diet.  Avoid alcoholic beverages for 24 hours or as instructed by your caregiver.  MEDICATIONS You may resume your normal medications unless your caregiver tells you otherwise.  WHAT YOU CAN EXPECT TODAY You may experience abdominal discomfort such as a feeling of fullness or gas pains.  FOLLOW-UP Your doctor will discuss the results of your test with you.  SEEK IMMEDIATE MEDICAL ATTENTION IF ANY OF THE FOLLOWING OCCUR: Excessive nausea (feeling sick to your stomach) and/or vomiting.  Severe abdominal pain and distention (swelling).  Trouble swallowing.  Temperature over 101 F (37.8 C).  Rectal bleeding or vomiting of blood.      your esophagus was dilated and biopsied.   you had a small polyp in your stomach which was removed   just mild hemorrhoids seen on your colonoscopy; recommend repeat  colonoscopy in 10 years for colon cancer screening   continue Protonix  40 mg twice daily best taken 30 minutes before breakfast and supper   continue Ibsrela  samples 50 mg twice daily.  Call the office when you run out and let us  know how you have done with that medication   further recommendations to follow pending review of pathology report

## 2024-12-31 LAB — SURGICAL PATHOLOGY

## 2025-02-09 ENCOUNTER — Ambulatory Visit: Admitting: Obstetrics
